# Patient Record
Sex: Male | Born: 1970 | Race: White | Hispanic: No | Marital: Married | Smoking: Never smoker
Health system: Southern US, Community
[De-identification: ages and names within clinical notes are randomized; demographics above are authoritative.]

## PROBLEM LIST (undated history)

## (undated) DIAGNOSIS — I10 Essential (primary) hypertension: Secondary | ICD-10-CM

## (undated) DIAGNOSIS — K219 Gastro-esophageal reflux disease without esophagitis: Secondary | ICD-10-CM

## (undated) HISTORY — PX: CHOLECYSTECTOMY: SHX55

## (undated) HISTORY — PX: APPENDECTOMY: SHX54

## (undated) HISTORY — PX: TONSILLECTOMY: SUR1361

---

## 2003-06-06 ENCOUNTER — Emergency Department (HOSPITAL_COMMUNITY): Admission: EM | Admit: 2003-06-06 | Discharge: 2003-06-06 | Payer: Self-pay

## 2005-05-15 ENCOUNTER — Ambulatory Visit: Payer: Self-pay | Admitting: Internal Medicine

## 2005-05-15 ENCOUNTER — Observation Stay (HOSPITAL_COMMUNITY): Admission: EM | Admit: 2005-05-15 | Discharge: 2005-05-16 | Payer: Self-pay | Admitting: Emergency Medicine

## 2005-05-17 ENCOUNTER — Ambulatory Visit: Payer: Self-pay

## 2008-07-07 ENCOUNTER — Emergency Department (HOSPITAL_COMMUNITY): Admission: EM | Admit: 2008-07-07 | Discharge: 2008-07-07 | Payer: Self-pay | Admitting: Emergency Medicine

## 2008-07-08 ENCOUNTER — Emergency Department (HOSPITAL_COMMUNITY): Admission: EM | Admit: 2008-07-08 | Discharge: 2008-07-08 | Payer: Self-pay | Admitting: Emergency Medicine

## 2008-07-09 ENCOUNTER — Ambulatory Visit (HOSPITAL_COMMUNITY): Admission: RE | Admit: 2008-07-09 | Discharge: 2008-07-09 | Payer: Self-pay | Admitting: Emergency Medicine

## 2008-08-30 ENCOUNTER — Emergency Department (HOSPITAL_COMMUNITY): Admission: EM | Admit: 2008-08-30 | Discharge: 2008-08-30 | Payer: Self-pay | Admitting: Emergency Medicine

## 2008-09-01 ENCOUNTER — Encounter (INDEPENDENT_AMBULATORY_CARE_PROVIDER_SITE_OTHER): Payer: Self-pay | Admitting: *Deleted

## 2008-09-10 ENCOUNTER — Ambulatory Visit (HOSPITAL_COMMUNITY): Admission: RE | Admit: 2008-09-10 | Discharge: 2008-09-10 | Payer: Self-pay | Admitting: General Surgery

## 2008-09-10 ENCOUNTER — Encounter (INDEPENDENT_AMBULATORY_CARE_PROVIDER_SITE_OTHER): Payer: Self-pay | Admitting: General Surgery

## 2010-05-13 LAB — CBC
MCHC: 35.1 g/dL (ref 30.0–36.0)
Platelets: 135 10*3/uL — ABNORMAL LOW (ref 150–400)
WBC: 6.3 10*3/uL (ref 4.0–10.5)

## 2010-05-13 LAB — BASIC METABOLIC PANEL
BUN: 11 mg/dL (ref 6–23)
CO2: 26 mEq/L (ref 19–32)
Calcium: 9.3 mg/dL (ref 8.4–10.5)
GFR calc Af Amer: >60 mL/min (ref >60)
GFR calc non Af Amer: >60 mL/min (ref >60)
Potassium: 4.3 mEq/L (ref 3.5–5.1)

## 2010-05-14 LAB — URINALYSIS, ROUTINE W REFLEX MICROSCOPIC
Bilirubin Urine: NEGATIVE
Glucose, UA: NEGATIVE mg/dL
Hgb urine dipstick: NEGATIVE
Nitrite: NEGATIVE
Specific Gravity, Urine: >1.03 — ABNORMAL HIGH (ref 1.005–1.030)
pH: 6.5 (ref 5.0–8.0)

## 2010-05-15 LAB — URINALYSIS, ROUTINE W REFLEX MICROSCOPIC
Glucose, UA: NEGATIVE mg/dL
Hgb urine dipstick: NEGATIVE
Ketones, ur: NEGATIVE mg/dL
Nitrite: NEGATIVE
Protein, ur: NEGATIVE mg/dL
Protein, ur: NEGATIVE mg/dL
Specific Gravity, Urine: 1.021 (ref 1.005–1.030)
Specific Gravity, Urine: >1.03 — ABNORMAL HIGH (ref 1.005–1.030)
Urobilinogen, UA: 0.2 mg/dL (ref 0.0–1.0)
pH: 7 (ref 5.0–8.0)

## 2010-05-15 LAB — COMPREHENSIVE METABOLIC PANEL
ALT: 64 U/L — ABNORMAL HIGH (ref 0–53)
Albumin: 3.6 g/dL (ref 3.5–5.2)
Albumin: 3.8 g/dL (ref 3.5–5.2)
Alkaline Phosphatase: 63 U/L (ref 39–117)
Alkaline Phosphatase: 69 U/L (ref 39–117)
CO2: 29 mEq/L (ref 19–32)
Calcium: 8.9 mg/dL (ref 8.4–10.5)
Chloride: 104 mEq/L (ref 96–112)
Creatinine, Ser: 0.94 mg/dL (ref 0.4–1.5)
GFR calc Af Amer: >60 mL/min (ref >60)
GFR calc non Af Amer: >60 mL/min (ref >60)
Glucose, Bld: 126 mg/dL — ABNORMAL HIGH (ref 70–99)
Glucose, Bld: 120 mg/dL — ABNORMAL HIGH (ref 70–99)
Sodium: 138 mEq/L (ref 135–145)
Total Bilirubin: 0.9 mg/dL (ref 0.3–1.2)
Total Bilirubin: 0.7 mg/dL (ref 0.3–1.2)
Total Protein: 6.5 g/dL (ref 6.0–8.3)

## 2010-05-15 LAB — CBC
HCT: 43.1 % (ref 39.0–52.0)
MCV: 93 fL (ref 78.0–100.0)
MCV: 92.7 fL (ref 78.0–100.0)
Platelets: 219 10*3/uL (ref 150–400)
RDW: 12.3 % (ref 11.5–15.5)
RDW: 12.7 % (ref 11.5–15.5)
WBC: 6.3 10*3/uL (ref 4.0–10.5)

## 2010-05-15 LAB — DIFFERENTIAL
Basophils Relative: 1 % (ref 0–1)
Eosinophils Absolute: 0.1 10*3/uL (ref 0.0–0.7)
Lymphocytes Relative: 25 % (ref 12–46)
Monocytes Absolute: 0.9 10*3/uL (ref 0.1–1.0)
Monocytes Relative: 10 % (ref 3–12)
Neutro Abs: 6.8 10*3/uL (ref 1.7–7.7)
Neutro Abs: 3.8 10*3/uL (ref 1.7–7.7)
Neutrophils Relative %: 61 % (ref 43–77)

## 2010-05-15 LAB — LIPASE, BLOOD: Lipase: 31 U/L (ref 11–59)

## 2010-05-15 LAB — URINE CULTURE: Colony Count: NO GROWTH

## 2010-06-20 NOTE — Op Note (Signed)
NAME:  Anthony Lin, SEABROOKS NO.:  [id number]   MEDICAL RECORD NO.:  [id number]          PATIENT TYPE:  AMB   LOCATION:  DAY                           FACILITY:  [facility]   PHYSICIAN:  Tilford Pillar, MD      DATE OF BIRTH:  May 20, 1970   DATE OF PROCEDURE:  09/10/2008  DATE OF DISCHARGE:  09/10/2008                               OPERATIVE REPORT   PREOPERATIVE DIAGNOSIS:  Biliary dyskinesia.   POSTOPERATIVE DIAGNOSIS:  Biliary dyskinesia.   PROCEDURE:  Laparoscopic cholecystectomy.   SURGEON:  Tilford Pillar, MD   ANESTHESIA:  General endotracheal, local anesthetic 0.5% Sensorcaine  plain.   SPECIMEN:  Gallbladder.   ESTIMATED BLOOD LOSS:  Minimal.   INDICATIONS:  The patient is a 40 year old male, who presented to my  office with a history of epigastric and right upper quadrant abdominal  pain, did have an extensive workup, was demonstrated to have decreased  biliary ejection fraction on hiatus scan as well as some exacerbation of  symptomatology.  The risks, benefits, and alternatives of the  laparoscopic possible open cholecystectomy for biliary dyskinesia was  discussed at length.  The patient including, but not limited to the risk  of bleeding, infection, bile leak, small bowel injury, bile duct injury,  as well as the possibility of intraoperative cardiac and pulmonary  events.  The patient's questions and concerns were addressed, and the  patient was consented for the planned procedure.   OPERATION:  The patient was taken to the operating room, placed in  supine position on the operating table, at which time the general  anesthetic was administered.  Once the patient was asleep, he was  endotracheally intubated by anesthesia.  At this time, his abdomen was  prepped with DuraPrep solution and draped in a standard fashion.  A stab  incision was created supraumbilically with a 11 blade scalpel.  Additional dissection down to the subcuticular tissue carried out  using  a Kocher clamp, which was utilized to grasp the anterior abdominal  fascia and move this anteriorly.  A Veress needle was then inserted.  Saline drop test was utilized to confirm intraperitoneal placement.  The  pneumoperitoneum was initiated.  Once the patient's pneumoperitoneum was  obtained, an 11-mm trocar was inserted over the laparoscope allowing  visualization of the trocar entering into the peritoneal cavity.  At  this time, the inner cannula was removed.  The laparoscope was  reinserted.  There is no evidence of any trocar or Veress needle  placement injury.  Remaining trocars were placed with a 11-mm trocar in  the epigastrium, a 5-mm trocar in the midline between two 11-mm trocars  and a 5-mm trocar in the right lateral abdominal wall.  The patient was  placed into a reverse Trendelenburg left lateral decubitus position.  The fundus of the gallbladder was identified and was grasped anteriorly.  The body of the gallbladder are bluntly stripped using a [place] as some of the adhesions are adherent to the capsule of the  liver.  These are taken down with electrocautery to avoid damaging  or  injury to the hepatic capsule.  Upon exposure of the infundibulum, the  peritoneal reflection off the infundibulum is bluntly stripped using the  [place].  The cystic duct and cystic artery are identified.  Window was created behind both these structures.  Two EndoClips were  placed proximally and 1 distally on the cystic artery.  Similarly, 3  EndoClips were placed proximally, 1 distally on the cystic duct.  The  cystic duct and cystic artery identified wide opened 2 more distal  clips.  At this time, electrocautery utilized to dissect the gallbladder  free from the gallbladder.  A small cholecystotomy was created during  the dissection.  This was controlled.  Once the gallbladder was  reinspected with the EndoCatch bag.  The gallbladder fossa was  inspected.   Hemostasis was excellent.  The EndoClips were noted to be in  excellent position.  There was no bleeding or bile leak noted.  The  peritoneal cavity was then irrigated with copious amount of sterile  saline and the returning aspirate was clear using a suction aspirator.  At this time, attention was turned to closure.   Using an Endoclose suture passing device, the 2-0 Vicryl sutures passed  through both the 11-mm trocar sites with these sutures in place.  The  gallbladder was grasped and retrieved through the umbilical trocar site  minimal.  Once, the patient was required to enlarge the trocar site  adequately to the gallbladder.  Once the gallbladder was free, it was  placed in the back table and sent as a permanent specimen to pathology.  Of note, he was treated in intact EndoCatch bag.  At this time, the  remaining fluid was aspirated as the patient returned back to supine  position.  Pneumoperitoneum was evacuated.  The Vicryl suture trocars  were removed.  The Vicryl sutures were secured.  Local anesthetic was  instilled, 4-0 Monocryl was utilized to reapproximate the skin edges at  all 4 trocar sites.  Skin was washed and dried with moist and dry towel.  Benzoin was applied around the incision.  Half-inch Steri-Strips were  placed.  The drapes removed.  The patient was allowed to come out of  general anesthetic and the patient was transferred back to regular  hospital bed.  He was transferred to the postanesthetic care unit in  stable condition.  At the conclusion of the procedure, all instrument,  sponge, and needle counts were correct.  The patient tolerated the  procedure extremely well.      Tilford Pillar, MD  Electronically Signed     BZ/MEDQ  D:  09/10/2008  T:  09/11/2008  Job:  [id number]

## 2010-06-20 NOTE — H&P (Signed)
NAME:  Anthony Lin, Anthony Lin NO.:  [id number]   MEDICAL RECORD NO.:  [id number]          PATIENT TYPE:  AMB   LOCATION:  DAY                           FACILITY:  [facility]   PHYSICIAN:  Tilford Pillar, MD      DATE OF BIRTH:  05-May-1970   DATE OF ADMISSION:  DATE OF DISCHARGE:  LH                              HISTORY & PHYSICAL   CHIEF COMPLAINT:  Epigastric abdominal pain.   HISTORY OF PRESENT ILLNESS:  The patient is a 40 year old male who  presented to my office with several month history of increasing episodes  of epigastric sharp constant pain, these episodes lasted several hours  and has spontaneously resolved.  He does state at some condition, it is  increased with fatty foods and fruits, does have some voiding with fatty  foods and fruits.  During exacerbation, there are still revealing  features.  Pain does radiate to the chest occasionally.  He had been  evaluated and was actually seen by a surgeon in [place]; however;  delay in the operative scheduling, I encouraged the patient to seek  secondary opinion and additional surgical consultation.  He has had no  fever or chills.  No history of nausea and vomiting.  No change in bowel  movements, although he does state one or two episodes suspicious for  pink colored stools.  No melena.  No hematochezia.  No change in urinary  history, again no jaundice.   PAST MEDICAL HISTORY:  None.   PAST SURGICAL HISTORY:  Last previous laparoscopic appendectomy.   MEDICATIONS:  Prilosec.   ALLERGIES:  No known drug allergies.   SOCIAL HISTORY:  No tobacco.  No alcohol.  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   PERTINENT FAMILY HISTORY:  He does have father who has gallstones.   REVIEW OF SYSTEMS:  Unremarkable other than abdominal or  gastrointestinal pain as per HPI, otherwise all other systems are  unremarkable including constitutional, eyes, ears, nose and throat,  respiratory, cardiovascular,  genitourinary, musculoskeletal, skin,  neuro, and endocrine.   PHYSICAL EXAMINATION:  GENERAL:  The patient is a mildly obese male.  He  is calm.  He is not in any acute distress.  He is alert and oriented x3.  HEENT:  Scalp, no deformities, no masses.  Eyes, pupils are equal,  round, reactive.  Extraocular movements are intact.  No scleral icterus  or conjunctival pallor is noted.  Oral mucosa is pink.  Normal  occlusion.  NECK:  Trachea is midline.  No cervical lymphadenopathy.  PULMONARY:  Unlabored respirations.  No wheezes.  No crackles.  Clear to  auscultation bilaterally.  CARDIOVASCULAR:  Regular rate and rhythm.  He has 2+ radial and femoral  pulses bilaterally.  ABDOMEN:  Positive bowel sounds.  Abdomen is soft.  He has mild right  upper quadrant abdominal pain.  He does not have any classic Murphy sign  on deep inspiration.  No hernias or masses.  He does have several small  scars, one around the umbilicus, one in the left lateral abdominal wall,  and one in the suprapubic region consistent with his previous  laparoscopic appendectomy.  SKIN:  Warm and dry.   PERTINENT LABORATORY AND RADIOGRAPHIC STUDIES:  The patient did have a  right upper ultrasound which demonstrated no evidence of cholelithiasis.  The HIDA scan obtained at [facility] demonstrated an  ejection fraction of 10%.  On administration of the IV today, the  patient did develop some mild epigastric abdominal pain similar to but  not as severe as his presenting features.   ASSESSMENT AND PLAN:  Biliary dyskinesia.  At this time, I did discuss  and reviewed with the patient the risks, benefits, and alternatives of  laparoscopic possible open cholecystectomy including but not limited  risk of bleeding, infection, bile leak, small-bowel injury, common bile  duct injury as well as the possibility of intraoperative cardiac and  pulmonary events.  At this time, I do suspect majority of his symptoms   are coming from gallbladder etiology.  I did discuss additional causes  of epigastric and right upper quadrant abdominal pain and I did discuss  with the patient that should he develop any symptomatology or continued  to have symptomatology in a postoperative state that at that time we  would consider scheduling and having evaluation of possible upper  endoscopy, however, at this time I think this will provide little  benefit and again discussed with the patient's surgical intervention and  at that time we will plan to proceed at the patient's earliest  convenience.      Tilford Pillar, MD  Electronically Signed     BZ/MEDQ  D:  09/09/2008  T:  09/10/2008  Job:  [id number]

## 2010-06-23 NOTE — H&P (Signed)
NAME:  Anthony Lin, Anthony Lin NO.:  [id number]   MEDICAL RECORD NO.:  [id number]          PATIENT TYPE:  INP   LOCATION:  1824                         FACILITY:  [facility]   PHYSICIAN:  Arvilla Meres, M.D. LHCDATE OF BIRTH:  09/25/1970   DATE OF ADMISSION:  05/15/2005  DATE OF DISCHARGE:                                HISTORY & PHYSICAL   PRIMARY CARE PHYSICIAN:  Kirstie Peri, MD   CHIEF COMPLAINT:  Chest pain.   HISTORY OF PRESENT ILLNESS:  Anthony Lin is a very pleasant 40 year old male  with no known history of coronary artery disease who presents to the [facility] emergency room tonight for evaluation of chest pain. He has had  atypical chest pain over the last two weeks. He describes it as a sharp  substernal sensation that lasts less than a second. He denies any exertional  chest pain. His pain usually occurs at rest and goes away on its own. He has  noted some dyspnea on exertion over the last couple of weeks. This is  unusual for him. He notes it with simple activities such as taking a shower  and walking up steps. He has also noted some palpitations. He saw his  primary care physician who set him for a stress test at [facility].  The patient, however, did not go for the test. Today, while at work, he  developed recurrent chest pain as well as shortness of breath and  palpitations. He felt some pressure under his ribs. This concerned him and  he came into the emergency room. He has noted some left elbow pain today.  He denies any radiation into his  jaw. He denies any associated nausea,  vomiting, or diaphoresis. He denies any syncope. He denies any relation to  meals.   PAST MEDICAL HISTORY:  Significant for gastroesophageal reflux disease. He  is status post tonsillectomy, adenoidectomy. He denies any history of  diabetes mellitus, hypertension, or hyperlipidemia.   MEDICATIONS:  Prilosec 20 mg daily.   ALLERGIES:  No known drug allergies.   SOCIAL  HISTORY:  The patient lives in [place] with his wife and his three  children. He denies any history of tobacco or alcohol abuse. He works in a  [employer].   FAMILY HISTORY:  Significant for coronary artery disease. His mother is  status post CABG.  She had her first myocardial infarction in her 40s. He  has a half brother with a myocardial infarction in his 40s.   REVIEW OF SYSTEMS:  Please see HPI. He denies any fever, chills, or sweats.  Denies any sore throat, odynophagia, or dysphagia. Denies any melena,  hematochezia, hematuria, or dysuria. Denies any myalgias. He does note  bilateral knee, elbow, and neck pain. He has had some headache recently that  are frontal in nature that are associated with nausea and photophobia. He  denies any orthopnea or paroxysmal nocturnal dyspnea. He denies any edema,  presyncope, or syncope. He denies any intermittent claudication. He denies  any symptoms consistent with TIAs or amaurosis fugax. He denies any bleeding  disorders.  He denies any rashes. He denies any heat or cold intolerance,  skin or hair changes, or voice changes. The rest of review of systems  negative.   PHYSICAL EXAMINATION:  GENERAL: He is a well-developed, well-nourished man.  VITAL SIGNS: Blood pressure is 117/7, pulse 68, respirations 20, temperature  98.3, oxygen saturation 98% on room air.  HEENT: Normocephalic and atraumatic. PERRLA. EOMI. Sclerae clear. Oropharynx  without exudate. Fair dentition.  NECK: Without lymphadenopathy. No JVD.  ENDOCRINE: Without thyromegaly. Carotids without bruits bilaterally.  CARDIAC: Normal S1 and S2. Regular rate and rhythm without murmurs, clicks,  rubs, or gallops  LUNGS: Clear to auscultation bilaterally without rales, rhonchi, or wheezes.  SKIN: Without rashes.  ABDOMEN: Soft, nontender, normoactive bowel sounds.  No hepatomegaly. No  pulsatile masses. No bruits.  VASCULAR EXAM:  2+ femoral artery pulses, dorsalis pedis pulses,  and  posterior tibialis pulses bilaterally. No bruits in the femoral artery is  noted.  EXTREMITIES: Without clubbing, cyanosis, or edema.  MUSCULOSKELETAL:  Without spine or CVA tenderness.  NEUROLOGIC: Alert and oriented times three. Cranial nerves II-XII grossly  intact. Strength 5/5 in all extremities in actual groups.   Chest x-ray reveals no acute disease. EKG reveals a normal sinus rhythm with  a heart rate of 77, normal axis, nonspecific ST-T wave changes. No Q-waves.   Laboratory data reveals a white count of 8500, hemoglobin 15.9, hematocrit  45.6, platelet count 387,000. Point of care enzymes are negative times two.   IMPRESSION:  1.  Atypical chest pain.  2.  Dyspnea on exertion.  3.  Family history of coronary artery disease.  4.  Gastroesophageal reflux disease.   PLAN:  The patient was interviewed and examined by Dr. Gala Romney. We plan to  admit him to [facility] tonight and check serial enzymes and EKGs.  We will also check a D-dimer. Will treat him with aspirin and beta blockers.  If he rules out will set him up for an outpatient stress Myoview study and  echocardiogram.      Tereso Newcomer, P.A.      Arvilla Meres, M.D. [facility]  Electronically Signed    SW/MEDQ  D:  05/15/2005  T:  05/15/2005  Job:  [id number]   cc:   Kirstie Peri, MD  Fax: [phone]

## 2010-06-23 NOTE — Discharge Summary (Signed)
NAME:  Anthony Lin, Anthony Lin NO.:  [id number]   MEDICAL RECORD NO.:  [id number]          PATIENT TYPE:  OBV   LOCATION:  4708                         FACILITY:  [facility]   PHYSICIAN:  Jesse Sans. Wall, M.D.   DATE OF BIRTH:  September 17, 1970   DATE OF ADMISSION:  05/15/2005  DATE OF DISCHARGE:  05/16/2005                                 DISCHARGE SUMMARY   PRIMARY CARE PHYSICIAN:  Kirstie Peri, MD.   CARDIOLOGIST:  Arvilla Meres, M.D.   DISCHARGE DIAGNOSIS:  Atypical chest pain, questionable gastrointestinal  etiology with negative cardiac work-up.   PAST MEDICAL HISTORY:  GERD.   HISTORY OF PRESENT ILLNESS:  Anthony Lin is a 40 year old Caucasian gentleman  with no known history of coronary artery disease who presented to [place]  [facility] Emergency Room for evaluation of chest pain.  Patient  had had atypical chest pain over two weeks.  He also noticed some  palpitations.  Patient initially saw his primary care physician who set him  up for a stress test at [facility], however, patient did not go for  the test and then on the day of admission while at work he developed  recurrent chest pain as well as some shortness of breath and palpitations  with some pressure under his ribs.  He came to [facility] [facility] Room for further evaluation.  Initial EKG showed normal  sinus rhythm with nonspecific STT wave changes.  Chest x-ray showing no  acute disease.  Point of care enzymes were negative x2 with hematocrit of  45.6.  Patient was seen by Dr. Gala Romney.  He was admitted for observation,  ruled out for myocardial infarction.  D-dimer within normal limits.  THS  within normal limits.  Blood pressure 117/65, patient afebrile.  Seen by Dr.  Juanito Doom on the day of discharge.  Patient discharged home with arrangements  for an outpatient stress Myoview scheduled for May 17, 2005, at 12:30.   DISCHARGE MEDICATIONS:  1.  Prilosec 20 mg  daily or as previously taken.  2.  Aspirin 81 mg daily.   Patient given instructions regarding preparation for stress test, to follow  up with Dr. Gala Romney on May 18, 2005, at 12:30.     Dorian Pod, NP      Jesse Sans. Wall, M.D.  Electronically Signed   MB/MEDQ  D:  06/25/2005  T:  06/26/2005  Job:  [id number]

## 2010-06-23 NOTE — Discharge Summary (Signed)
NAME:  Anthony Lin, Anthony Lin NO.:  [id number]   MEDICAL RECORD NO.:  [id number]          PATIENT TYPE:  OBV   LOCATION:  4708                         FACILITY:  [facility]   PHYSICIAN:  Jesse Sans. Wall, M.D.   DATE OF BIRTH:  1970-10-03   DATE OF ADMISSION:  05/15/2005  DATE OF DISCHARGE:  05/16/2005                                 DISCHARGE SUMMARY   DISCHARGING DIAGNOSIS:  Atypical chest pain, negative cardiac enzymes, EKG  without acute changes.   PAST MEDICAL HISTORY:  1.  Hyperlipidemia.  2.  GERD.   FAMILY HISTORY:  Coronary artery disease.   Status post tonsillectomy and adenoidectomy.   HOSPITAL COURSE:  Anthony Lin is a 40 year old Caucasian gentleman with no  known history of coronary artery disease, who is followed by Kirstie Peri,  M.D., in [place].  He was initially seen by Arvilla Meres, M.D., in  consultation at [facility] ER for evaluation of chest pain.  This pain is  somewhat atypical.  He has had it for several weeks prior to admission.   Initial lab work:  Chest x-ray showing no acute disease.  EKG:  Normal sinus  rhythm, nonspecific ST-T wave changes.  Hemoglobin 15.9.  Point of care  cardiac enzymes negative x2.  D-dimer of 0.22.   The patient was admitted for observation.  Ruled out for myocardial  infarction.  EKG without any changes.  TSH within normal limits.  Dr. Juanito Doom in to see the patient on the day of discharge.  Blood pressure 117/65,  heart rate 60, patient afebrile, saturation 97% on room air.  Patient being  discharged home with scheduled adenosine Myoview.  Patient to follow up with  Dr. Gala Romney.   At time of discharge medications include:  1.  Prilosec 20 mg daily as previously taken.  2.  Aspirin 81 mg daily.   The patient was scheduled for an outpatient Myoview on May 18, 2005, at  12:30.  Patient to follow up with Dr. Gala Romney to review results of the  stress test.  The patient understood discharge instructions.   DURATION OF DISCHARGE ENCOUNTER:  20 minutes.      Dorian Pod, NP      Jesse Sans. Wall, M.D.  Electronically Signed    MB/MEDQ  D:  06/12/2005  T:  06/13/2005  Job:  [id number]   cc:   Kirstie Peri, MD  Fax: [phone]

## 2010-11-25 IMAGING — CR DG ABDOMEN ACUTE W/ 1V CHEST
4 series · 4 of 4 positions shown · non-contrast
Comparison: Chest radiograph 05/15/2005.

CLINICAL DATA: 37-year-old male with sudden onset of epigastric,
abdominal pain.

ACUTE ABDOMEN SERIES (ABDOMEN 2 VIEW & CHEST 1 VIEW)

[w chest pa]
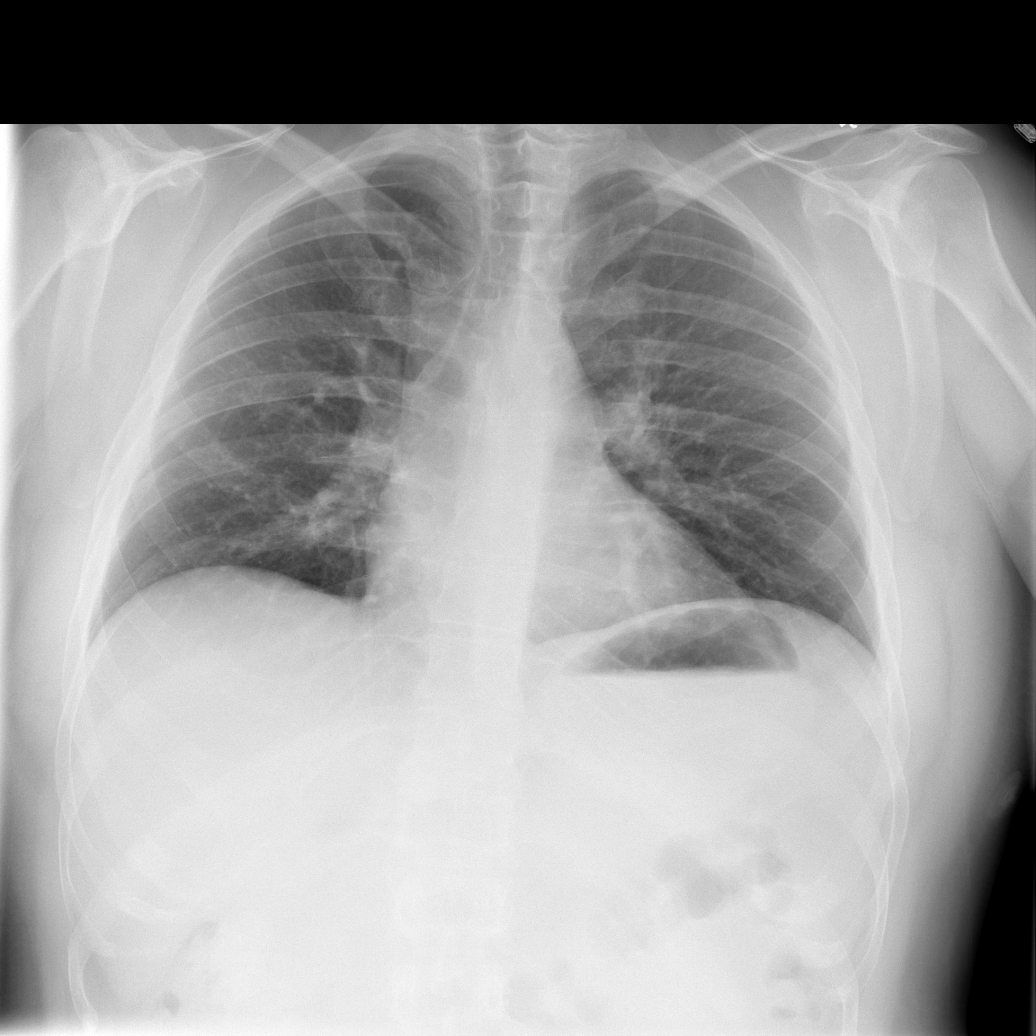

[w abdomen upright]
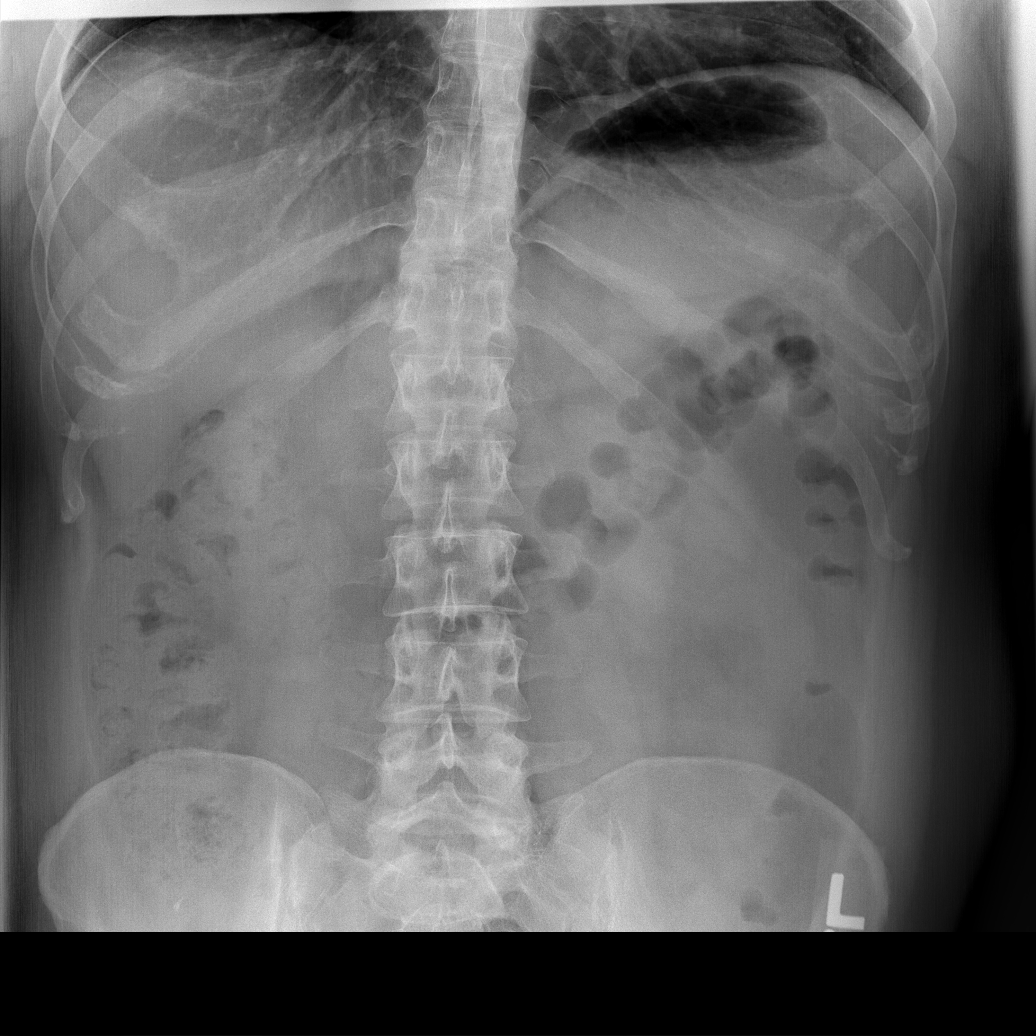

[t abdomen supine (1 of 2)]
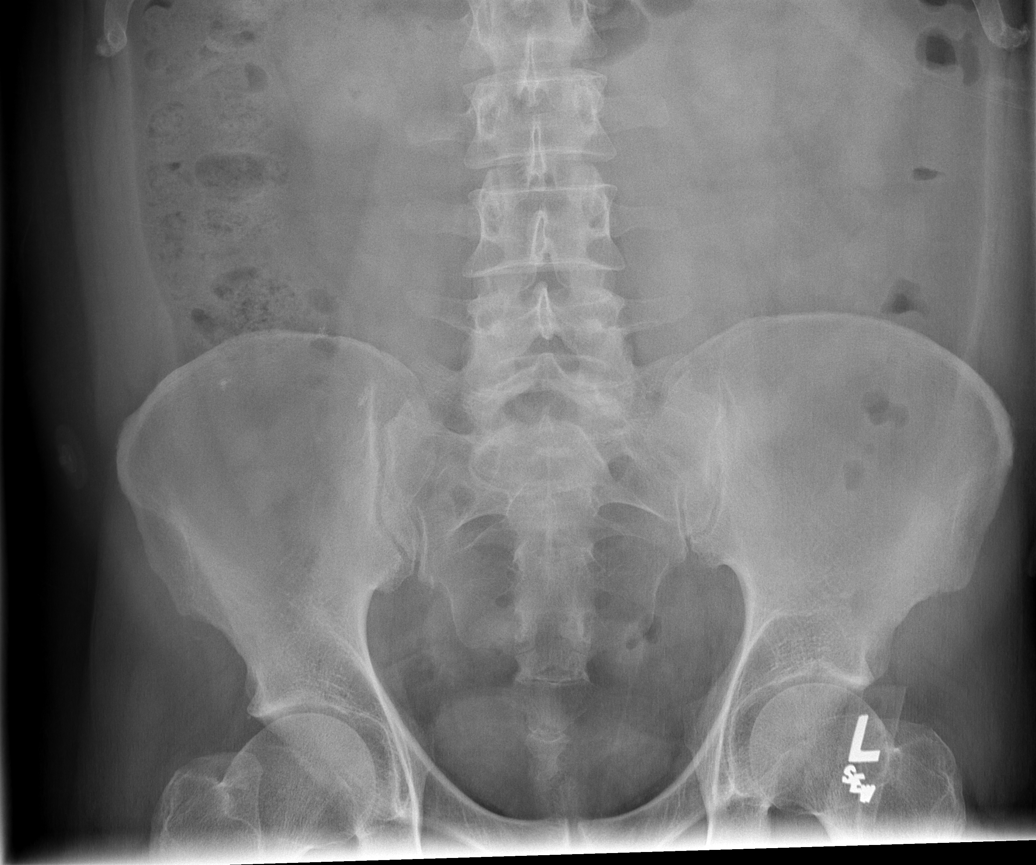

[t abdomen supine (2 of 2)]
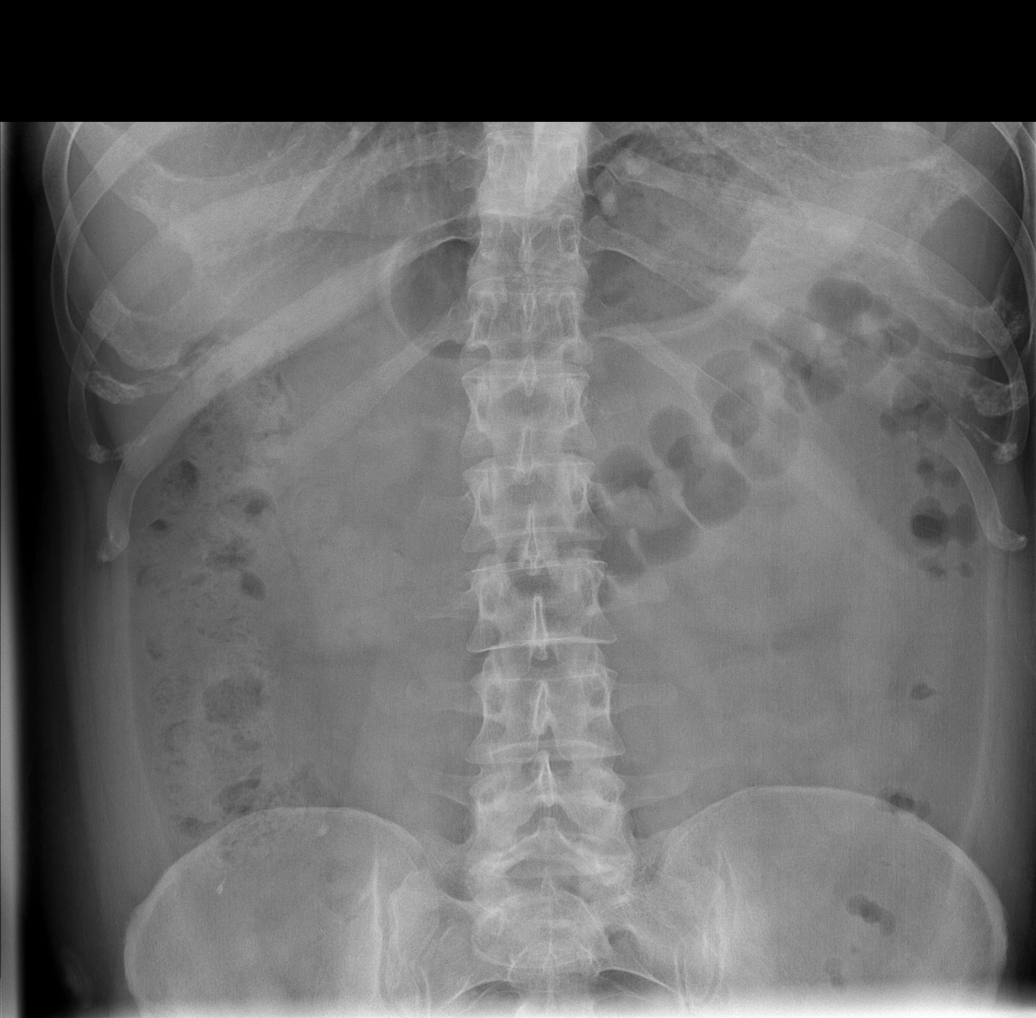

[4 of 4 positions shown; findings below may reference images not displayed]

FINDINGS: Slightly lower lung volumes.  Cardiac size and
mediastinal contours are within normal limits.  The lungs are
clear.  No pneumothorax or pneumoperitoneum.

Nonobstructed bowel gas pattern. Visceral contours are within
normal limits.  No definite urologic calculus identified.  No acute
osseous abnormality identified.
IMPRESSION: 1. Nonobstructed bowel gas pattern, no free air.
2. No acute cardiopulmonary abnormality.

## 2013-05-18 ENCOUNTER — Encounter (INDEPENDENT_AMBULATORY_CARE_PROVIDER_SITE_OTHER): Payer: Self-pay

## 2013-05-18 ENCOUNTER — Encounter: Payer: Self-pay | Admitting: Family Medicine

## 2013-05-18 ENCOUNTER — Ambulatory Visit (INDEPENDENT_AMBULATORY_CARE_PROVIDER_SITE_OTHER): Payer: Commercial Managed Care - PPO | Admitting: Family Medicine

## 2013-05-18 VITALS — BP 137/78 | HR 84 | Temp 98.2°F | Ht 71.25 in | Wt 259.0 lb

## 2013-05-18 DIAGNOSIS — J209 Acute bronchitis, unspecified: Secondary | ICD-10-CM

## 2013-05-18 MED ORDER — AMOXICILLIN 875 MG PO TABS: 875.0000 mg | ORAL_TABLET | Freq: Two times a day (BID) | ORAL | Status: DC

## 2013-05-18 MED ORDER — ALBUTEROL SULFATE HFA 108 (90 BASE) MCG/ACT IN AERS: 2.0000 | INHALATION_SPRAY | Freq: Four times a day (QID) | RESPIRATORY_TRACT | Status: DC | PRN

## 2013-05-18 MED ORDER — METHYLPREDNISOLONE ACETATE 80 MG/ML IJ SUSP
80.0000 mg | Freq: Once | INTRAMUSCULAR | Status: AC
  Administered 2013-05-18: 80 mg via INTRAMUSCULAR

## 2013-05-18 MED ORDER — HYDROCODONE-HOMATROPINE 5-1.5 MG/5ML PO SYRP: 5.0000 mL | ORAL_SOLUTION | Freq: Three times a day (TID) | ORAL | Status: DC | PRN

## 2013-05-18 NOTE — Progress Notes (Signed)
   Subjective:    Patient ID: Anthony Lin, male    DOB: 03-12-1970, 43 y.o.   MRN: [id number]  HPI  This 43 y.o. male presents for evaluation of uri sx's and cough.  He is having difficulty Sleeping at night due to severe cough.  Review of Systems C/o uri sx's and cough   No chest pain, SOB, HA, dizziness, vision change, N/V, diarrhea, constipation, dysuria, urinary urgency or frequency, myalgias, arthralgias or rash.  Objective:   Physical Exam  Vital signs noted  Well developed well nourished male.  HEENT - Head atraumatic Normocephalic                Eyes - PERRLA, Conjuctiva - clear Sclera- Clear EOMI                Ears - EAC's Wnl TM's Wnl Gross Hearing WNL                Nose - Nares patent                 Throat - oropharanx wnl Respiratory - Lungs with expiratory wheezes bilateral Cardiac - RRR S1 and S2 without murmur GI - Abdomen soft Nontender and bowel sounds active x 4       Assessment & Plan:  Acute bronchitis - Plan: amoxicillin (AMOXIL) 875 MG tablet, albuterol (PROVENTIL HFA;VENTOLIN HFA) 108 (90 BASE) MCG/ACT inhaler, HYDROcodone-homatropine (HYCODAN) 5-1.5 MG/5ML syrup, methylPREDNISolone acetate (DEPO-MEDROL) injection 80 mg  Push po fluids, rest, tylenol and motrin otc prn as directed for fever, arthralgias, and myalgias.  Follow up prn if sx's continue or persist.  Lysbeth Penner FNP

## 2013-10-23 ENCOUNTER — Ambulatory Visit (INDEPENDENT_AMBULATORY_CARE_PROVIDER_SITE_OTHER): Payer: Commercial Managed Care - PPO | Admitting: Family Medicine

## 2013-10-23 VITALS — BP 139/89 | HR 90 | Temp 98.6°F | Ht 71.25 in | Wt 260.6 lb

## 2013-10-23 DIAGNOSIS — M659 Synovitis and tenosynovitis, unspecified: Secondary | ICD-10-CM

## 2013-10-23 DIAGNOSIS — IMO0002 Reserved for concepts with insufficient information to code with codable children: Secondary | ICD-10-CM

## 2013-10-23 DIAGNOSIS — L03114 Cellulitis of left upper limb: Secondary | ICD-10-CM

## 2013-10-23 MED ORDER — DOXYCYCLINE HYCLATE 100 MG PO TABS
100.0000 mg | ORAL_TABLET | Freq: Two times a day (BID) | ORAL | Status: DC
Start: 2013-10-23 — End: 2014-03-24

## 2013-10-23 MED ORDER — NAPROXEN 500 MG PO TABS: 500.0000 mg | ORAL_TABLET | Freq: Two times a day (BID) | ORAL | Status: DC

## 2013-10-23 NOTE — Progress Notes (Signed)
   Subjective:    Patient ID: Anthony Lin, male    DOB: 02-23-1970, 43 y.o.   MRN: [id number]  HPI This 43 y.o. male presents for evaluation of possible spider bite on left elbow and now has pain Radiating into left forearm.   Review of Systemsc/o left arm pain No chest pain, SOB, HA, dizziness, vision change, N/V, diarrhea, constipation, dysuria, urinary urgency or frequency, myalgias, arthralgias or rash.     Objective:   Physical Exam  Vital signs noted  Well developed well nourished male.  HEENT - Head atraumatic Normocephalic Respiratory - Lungs CTA bilateral Cardiac - RRR S1 and S2 without murmur GI - Abdomen soft Nontender and bowel sounds active x 4 Extremities - No edema. Neuro - Grossly intact. MS - Tenderness left forearm  Skin - Erythema left olecranon area      Assessment & Plan:  Left arm cellulitis - Plan: naproxen (NAPROSYN) 500 MG tablet, doxycycline (VIBRA-TABS) 100 MG tablet  Tenosynovitis - Plan: naproxen (NAPROSYN) 500 MG tablet, doxycycline (VIBRA-TABS) 100 MG tablet  Lysbeth Penner FNP

## 2014-03-24 ENCOUNTER — Ambulatory Visit (INDEPENDENT_AMBULATORY_CARE_PROVIDER_SITE_OTHER): Payer: Commercial Managed Care - PPO | Admitting: Family

## 2014-03-24 ENCOUNTER — Encounter: Payer: Self-pay | Admitting: Family

## 2014-03-24 VITALS — BP 135/89 | HR 98 | Temp 98.3°F | Ht 71.25 in | Wt 261.8 lb

## 2014-03-24 DIAGNOSIS — J019 Acute sinusitis, unspecified: Secondary | ICD-10-CM

## 2014-03-24 MED ORDER — AMOXICILLIN-POT CLAVULANATE 875-125 MG PO TABS: 1.0000 | ORAL_TABLET | Freq: Two times a day (BID) | ORAL | Status: DC

## 2014-03-24 MED ORDER — ALBUTEROL SULFATE HFA 108 (90 BASE) MCG/ACT IN AERS: 2.0000 | INHALATION_SPRAY | Freq: Four times a day (QID) | RESPIRATORY_TRACT | Status: DC | PRN

## 2014-03-24 NOTE — Progress Notes (Signed)
Subjective:    Patient ID: Anthony Lin, male    DOB: [birth year], 44 y.o.   MRN: [id number]  Sinusitis This is a new problem. The current episode started in the past 7 days. The problem has been waxing and waning since onset. The maximum temperature recorded prior to his arrival was 101 - 101.9 F. His pain is at a severity of 10/10. The pain is moderate. Associated symptoms include coughing, ear pain, headaches, shortness of breath, sinus pressure, sneezing and a sore throat. Past treatments include oral decongestants. The treatment provided mild relief.  Headache  Associated symptoms include coughing, ear pain, sinus pressure and a sore throat.      Review of Systems  Constitutional: Negative.   HENT: Positive for ear pain, sinus pressure, sneezing and sore throat.   Respiratory: Positive for cough and shortness of breath.   Cardiovascular: Negative.   Gastrointestinal: Negative.   Endocrine: Negative.   Genitourinary: Negative.   Musculoskeletal: Negative.   Neurological: Positive for headaches.  Hematological: Negative.   Psychiatric/Behavioral: Negative.   All other systems reviewed and are negative.      Objective:   Physical Exam  Constitutional: He is oriented to person, place, and time. He appears well-developed and well-nourished. No distress.  HENT:  Head: Normocephalic.  Right Ear: External ear normal.  Left Ear: External ear normal.  Nose: Right sinus exhibits maxillary sinus tenderness and frontal sinus tenderness. Left sinus exhibits maxillary sinus tenderness and frontal sinus tenderness.  Eyes: Pupils are equal, round, and reactive to light. Right eye exhibits no discharge. Left eye exhibits no discharge.  Neck: Normal range of motion. Neck supple. No thyromegaly present.  Cardiovascular: Normal rate, regular rhythm, normal heart sounds and intact distal pulses.   No murmur heard. Pulmonary/Chest: Effort normal and breath sounds normal. No respiratory  distress. He has no wheezes.  Abdominal: Soft. Bowel sounds are normal. He exhibits no distension. There is no tenderness.  Musculoskeletal: Normal range of motion. He exhibits no edema or tenderness.  Neurological: He is alert and oriented to person, place, and time. He has normal reflexes. No cranial nerve deficit.  Skin: Skin is warm and dry. No rash noted. No erythema.  Psychiatric: He has a normal mood and affect. His behavior is normal. Judgment and thought content normal.  Vitals reviewed.     BP 135/89 mmHg  Pulse 98  Temp(Src) 98.3 F (36.8 C) (Oral)  Ht 5' 11.25" (1.81 m)  Wt 261 lb 12.8 oz (118.752 kg)  BMI 36.25 kg/m2     Assessment & Plan:  1. Acute sinusitis, recurrence not specified, unspecified location -- Take meds as prescribed - Use a cool mist humidifier  -Use saline nose sprays frequently -Saline irrigations of the nose can be very helpful if done frequently.  * 4X daily for 1 week*  * Use of a nettie pot can be helpful with this. Follow directions with this* -Force fluids -For any cough or congestion  Use plain Mucinex- regular strength or max strength is fine   * Children- consult with Pharmacist for dosing -For fever or aces or pains- take tylenol or ibuprofen appropriate for age and weight.  * for fevers greater than 101 orally you may alternate ibuprofen and tylenol every  3 hours. -Throat lozenges if help - amoxicillin-clavulanate (AUGMENTIN) 875-125 MG per tablet; Take 1 tablet by mouth 2 (two) times daily.  Dispense: 14 tablet; Refill: 0 - albuterol (PROVENTIL HFA;VENTOLIN HFA) 108 (90 BASE) MCG/ACT inhaler; Inhale  2 puffs into the lungs every 6 (six) hours as needed for wheezing or shortness of breath.  Dispense: 1 Inhaler; Refill: [place], FNP

## 2014-03-24 NOTE — Patient Instructions (Signed)

## 2015-09-19 ENCOUNTER — Encounter: Payer: Self-pay | Admitting: Pediatrics

## 2015-09-19 ENCOUNTER — Ambulatory Visit (INDEPENDENT_AMBULATORY_CARE_PROVIDER_SITE_OTHER): Payer: Commercial Managed Care - PPO | Admitting: Pediatrics

## 2015-09-19 VITALS — BP 138/82 | HR 70 | Temp 97.5°F | Ht 71.25 in | Wt 269.0 lb

## 2015-09-19 DIAGNOSIS — J309 Allergic rhinitis, unspecified: Secondary | ICD-10-CM | POA: Diagnosis not present

## 2015-09-19 DIAGNOSIS — Z6837 Body mass index (BMI) 37.0-37.9, adult: Secondary | ICD-10-CM | POA: Diagnosis not present

## 2015-09-19 DIAGNOSIS — R109 Unspecified abdominal pain: Secondary | ICD-10-CM

## 2015-09-19 DIAGNOSIS — M545 Low back pain, unspecified: Secondary | ICD-10-CM | POA: Insufficient documentation

## 2015-09-19 LAB — URINALYSIS, COMPLETE
Bilirubin, UA: NEGATIVE
GLUCOSE, UA: NEGATIVE
KETONES UA: NEGATIVE
LEUKOCYTES UA: NEGATIVE
Nitrite, UA: NEGATIVE
PH UA: 6.5 (ref 5.0–7.5)
PROTEIN UA: NEGATIVE
RBC, UA: NEGATIVE
Specific Gravity, UA: 1.02 (ref 1.005–1.030)
Urobilinogen, Ur: 1 mg/dL (ref 0.2–1.0)

## 2015-09-19 LAB — BAYER DCA HB A1C WAIVED: HB A1C: 5.3 % (ref <7.0)

## 2015-09-19 LAB — MICROSCOPIC EXAMINATION
Bacteria, UA: NONE SEEN
Epithelial Cells (non renal): NONE SEEN /hpf (ref 0–10)
RBC MICROSCOPIC, UA: NONE SEEN /HPF (ref 0–?)
WBC UA: NONE SEEN /HPF (ref 0–?)

## 2015-09-19 MED ORDER — CYCLOBENZAPRINE HCL 10 MG PO TABS: 10.0000 mg | ORAL_TABLET | Freq: Three times a day (TID) | ORAL | 0 refills | Status: DC | PRN

## 2015-09-19 NOTE — Progress Notes (Addendum)
    Subjective:    Patient ID: Anthony Lin, male    DOB: 1970-03-07, 45 y.o.   MRN: [id number]  CC: Back Pain (1 month)   HPI: Anthony Lin is a 45 y.o. male presenting for Back Pain (1 month)  L flank pain ongoing off and on for past month Bad in the morning Eases up once he starts moving  Has had kidney stone in the past, in some ways pain feels similar Moving around sometimes helps, sitting in recliner makes it worse  Polyuria last few days No fevers   Relevant past medical, surgical, family and social history reviewed. Interim medical history since our last visit reviewed. Allergies and medications reviewed and updated.  History  Smoking Status  . Never Smoker  Smokeless Tobacco  . Never Used    ROS: Per HPI      Objective:    BP 138/82   Pulse 70   Temp 97.5 F (36.4 C) (Oral)   Ht 5' 11.25" (1.81 m)   Wt 269 lb (122 kg)   BMI 37.26 kg/m   Wt Readings from Last 3 Encounters:  09/19/15 269 lb (122 kg)  03/24/14 261 lb 12.8 oz (118.8 kg)  10/23/13 260 lb 9.6 oz (118.2 kg)     Gen: NAD, alert, cooperative with exam, NCAT EYES: EOMI, no conjunctival injection, or no icterus ENT:  Nasal turbinates pink, equal b/l, OP without erythema LYMPH: no cervical LAD CV: WWP Resp: normal WOB Abd: +BS, soft, NTND. no guarding or organomegaly Ext: No edema, warm Neuro: Alert and oriented, strength equal b/l UE and LE, coordination grossly normal, patellar reflex 1+ b/l, SLR+  MSK: TTP L lower back paraspinous muscles     Assessment & Plan:  Anthony Lin was seen today for back pain.  Diagnoses and all orders for this visit:  Flank pain UA negative If continues to have pain, will need to rul eout kidney stone -     Urinalysis, Complete  BMI 37.0-37.9, adult Cont lifestyle changes, increase activity, decrease sugar intake -     A1c 5.3 in clinic -     CMP14+EGFR -     Lipid panel  Allergic rhinitis, unspecified allergic rhinitis type flonase twice a  day  Left-sided low back pain without sciatica NSAIDs, rest, ice, heat, gentle back exercises Trial flexeril  Follow up plan: Return in about 2 weeks (around 10/03/2015).  Assunta Found, MD [facility] 09/19/2015, 3:11 PM

## 2015-09-19 NOTE — Addendum Note (Signed)
Addended by: Eustaquio Maize on: 09/19/2015 05:30 PM   Modules accepted: Orders

## 2015-09-19 NOTE — Patient Instructions (Addendum)

## 2015-09-20 LAB — LIPID PANEL
Chol/HDL Ratio: 7.5 ratio units — ABNORMAL HIGH (ref 0.0–5.0)
Cholesterol, Total: 232 mg/dL — ABNORMAL HIGH (ref 100–199)
HDL: 31 mg/dL — AB (ref >39)
LDL Calculated: 147 mg/dL — ABNORMAL HIGH (ref 0–99)
TRIGLYCERIDES: 269 mg/dL — AB (ref 0–149)
VLDL Cholesterol Cal: 54 mg/dL — ABNORMAL HIGH (ref 5–40)

## 2015-09-20 LAB — CMP14+EGFR
A/G RATIO: 1.6 (ref 1.2–2.2)
ALT: 44 IU/L (ref 0–44)
AST: 25 IU/L (ref 0–40)
Albumin: 4 g/dL (ref 3.5–5.5)
Alkaline Phosphatase: 64 IU/L (ref 39–117)
BUN/Creatinine Ratio: 11 (ref 9–20)
BUN: 11 mg/dL (ref 6–24)
Bilirubin Total: 0.5 mg/dL (ref 0.0–1.2)
CALCIUM: 9.1 mg/dL (ref 8.7–10.2)
CHLORIDE: 100 mmol/L (ref 96–106)
CO2: 26 mmol/L (ref 18–29)
Creatinine, Ser: 1.01 mg/dL (ref 0.76–1.27)
GFR calc Af Amer: 105 mL/min/{1.73_m2} (ref >59)
GFR, EST NON AFRICAN AMERICAN: 91 mL/min/{1.73_m2} (ref >59)
Globulin, Total: 2.5 g/dL (ref 1.5–4.5)
Glucose: 88 mg/dL (ref 65–99)
POTASSIUM: 4.4 mmol/L (ref 3.5–5.2)
Sodium: 138 mmol/L (ref 134–144)
Total Protein: 6.5 g/dL (ref 6.0–8.5)

## 2018-09-26 ENCOUNTER — Ambulatory Visit: Payer: Commercial Managed Care - PPO | Admitting: Family Medicine

## 2018-09-26 ENCOUNTER — Other Ambulatory Visit: Payer: Self-pay

## 2018-09-26 DIAGNOSIS — Z20822 Contact with and (suspected) exposure to covid-19: Secondary | ICD-10-CM

## 2018-09-28 LAB — NOVEL CORONAVIRUS, NAA: SARS-CoV-2, NAA: NOT DETECTED

## 2019-07-31 ENCOUNTER — Other Ambulatory Visit: Payer: Self-pay | Admitting: Orthopedic Surgery

## 2019-08-03 ENCOUNTER — Other Ambulatory Visit: Payer: Self-pay | Admitting: Orthopedic Surgery

## 2019-09-07 ENCOUNTER — Other Ambulatory Visit: Payer: Self-pay

## 2019-09-07 ENCOUNTER — Encounter (HOSPITAL_BASED_OUTPATIENT_CLINIC_OR_DEPARTMENT_OTHER): Payer: Self-pay | Admitting: Orthopedic Surgery

## 2019-09-10 ENCOUNTER — Encounter (HOSPITAL_BASED_OUTPATIENT_CLINIC_OR_DEPARTMENT_OTHER)
Admission: RE | Admit: 2019-09-10 | Discharge: 2019-09-10 | Disposition: A | Discharge status: Home / Self Care | Payer: BC Managed Care – PPO | Source: Ambulatory Visit | Attending: Orthopedic Surgery | Admitting: Orthopedic Surgery

## 2019-09-10 ENCOUNTER — Other Ambulatory Visit (HOSPITAL_COMMUNITY)
Admission: RE | Admit: 2019-09-10 | Discharge: 2019-09-10 | Disposition: A | Discharge status: Home / Self Care | Payer: BC Managed Care – PPO | Source: Ambulatory Visit | Attending: Orthopedic Surgery | Admitting: Orthopedic Surgery

## 2019-09-10 DIAGNOSIS — Z01818 Encounter for other preprocedural examination: Secondary | ICD-10-CM | POA: Insufficient documentation

## 2019-09-10 DIAGNOSIS — Z01812 Encounter for preprocedural laboratory examination: Secondary | ICD-10-CM | POA: Diagnosis present

## 2019-09-10 DIAGNOSIS — Z20822 Contact with and (suspected) exposure to covid-19: Secondary | ICD-10-CM | POA: Diagnosis not present

## 2019-09-10 LAB — BASIC METABOLIC PANEL
Anion gap: 9 (ref 5–15)
BUN: 12 mg/dL (ref 6–20)
CO2: 26 mmol/L (ref 22–32)
Calcium: 9.1 mg/dL (ref 8.9–10.3)
Chloride: 101 mmol/L (ref 98–111)
Creatinine, Ser: 0.96 mg/dL (ref 0.61–1.24)
GFR calc Af Amer: >60 mL/min (ref >60)
GFR calc non Af Amer: >60 mL/min (ref >60)
Glucose, Bld: 104 mg/dL — ABNORMAL HIGH (ref 70–99)
Potassium: 4.4 mmol/L (ref 3.5–5.1)
Sodium: 136 mmol/L (ref 135–145)

## 2019-09-10 LAB — SARS CORONAVIRUS 2 (TAT 6-24 HRS): SARS Coronavirus 2: NEGATIVE

## 2019-09-10 NOTE — Progress Notes (Signed)

## 2019-09-14 ENCOUNTER — Encounter (HOSPITAL_BASED_OUTPATIENT_CLINIC_OR_DEPARTMENT_OTHER): Payer: Self-pay | Admitting: Orthopedic Surgery

## 2019-09-14 ENCOUNTER — Ambulatory Visit (HOSPITAL_BASED_OUTPATIENT_CLINIC_OR_DEPARTMENT_OTHER): Payer: BC Managed Care – PPO | Admitting: Anesthesiology

## 2019-09-14 ENCOUNTER — Encounter (HOSPITAL_BASED_OUTPATIENT_CLINIC_OR_DEPARTMENT_OTHER)
Admission: RE | Disposition: A | Discharge status: Home / Self Care | Payer: Self-pay | Source: Home / Self Care | Attending: Orthopedic Surgery

## 2019-09-14 ENCOUNTER — Ambulatory Visit (HOSPITAL_BASED_OUTPATIENT_CLINIC_OR_DEPARTMENT_OTHER)
Admission: RE | Admit: 2019-09-14 | Discharge: 2019-09-14 | Disposition: A | Discharge status: Home / Self Care | Payer: BC Managed Care – PPO | Attending: Orthopedic Surgery | Admitting: Orthopedic Surgery

## 2019-09-14 DIAGNOSIS — Z9049 Acquired absence of other specified parts of digestive tract: Secondary | ICD-10-CM | POA: Insufficient documentation

## 2019-09-14 DIAGNOSIS — Z79899 Other long term (current) drug therapy: Secondary | ICD-10-CM | POA: Diagnosis not present

## 2019-09-14 DIAGNOSIS — I10 Essential (primary) hypertension: Secondary | ICD-10-CM | POA: Insufficient documentation

## 2019-09-14 DIAGNOSIS — K219 Gastro-esophageal reflux disease without esophagitis: Secondary | ICD-10-CM | POA: Diagnosis not present

## 2019-09-14 DIAGNOSIS — G5601 Carpal tunnel syndrome, right upper limb: Secondary | ICD-10-CM | POA: Diagnosis not present

## 2019-09-14 DIAGNOSIS — Z881 Allergy status to other antibiotic agents status: Secondary | ICD-10-CM | POA: Insufficient documentation

## 2019-09-14 HISTORY — DX: Gastro-esophageal reflux disease without esophagitis: K21.9

## 2019-09-14 HISTORY — DX: Essential (primary) hypertension: I10

## 2019-09-14 HISTORY — PX: CARPAL TUNNEL RELEASE: SHX101

## 2019-09-14 SURGERY — CARPAL TUNNEL RELEASE
Anesthesia: Monitor Anesthesia Care | Site: Hand | Laterality: Right

## 2019-09-14 MED ORDER — ONDANSETRON HCL 4 MG/2ML IJ SOLN
INTRAMUSCULAR | Status: AC
  Filled 2019-09-14: qty 2

## 2019-09-14 MED ORDER — FENTANYL CITRATE (PF) 100 MCG/2ML IJ SOLN: 25.0000 ug | INTRAMUSCULAR | Status: DC | PRN

## 2019-09-14 MED ORDER — BUPIVACAINE HCL (PF) 0.25 % IJ SOLN
INTRAMUSCULAR | Status: AC
  Filled 2019-09-14: qty 30

## 2019-09-14 MED ORDER — ONDANSETRON HCL 4 MG/2ML IJ SOLN: 4.0000 mg | Freq: Once | INTRAMUSCULAR | Status: DC | PRN

## 2019-09-14 MED ORDER — PROPOFOL 500 MG/50ML IV EMUL
INTRAVENOUS | Status: DC | PRN
Start: 2019-09-14 — End: 2019-09-14
  Administered 2019-09-14: 75 ug/kg/min via INTRAVENOUS

## 2019-09-14 MED ORDER — CEFAZOLIN SODIUM-DEXTROSE 2-4 GM/100ML-% IV SOLN
INTRAVENOUS | Status: AC
  Filled 2019-09-14: qty 100

## 2019-09-14 MED ORDER — LACTATED RINGERS IV SOLN: INTRAVENOUS | Status: DC

## 2019-09-14 MED ORDER — HYDROCODONE-ACETAMINOPHEN 5-325 MG PO TABS: ORAL_TABLET | ORAL | 0 refills | Status: DC

## 2019-09-14 MED ORDER — FENTANYL CITRATE (PF) 100 MCG/2ML IJ SOLN
INTRAMUSCULAR | Status: AC
  Filled 2019-09-14: qty 2

## 2019-09-14 MED ORDER — BUPIVACAINE HCL (PF) 0.25 % IJ SOLN
INTRAMUSCULAR | Status: DC | PRN
  Administered 2019-09-14: 10 mL

## 2019-09-14 MED ORDER — PROPOFOL 500 MG/50ML IV EMUL
INTRAVENOUS | Status: AC
  Filled 2019-09-14: qty 50

## 2019-09-14 MED ORDER — ONDANSETRON HCL 4 MG/2ML IJ SOLN
INTRAMUSCULAR | Status: DC | PRN
  Administered 2019-09-14: 4 mg via INTRAVENOUS

## 2019-09-14 MED ORDER — CEFAZOLIN SODIUM-DEXTROSE 2-4 GM/100ML-% IV SOLN
2.0000 g | INTRAVENOUS | Status: AC
  Administered 2019-09-14: 2 g via INTRAVENOUS

## 2019-09-14 MED ORDER — FENTANYL CITRATE (PF) 100 MCG/2ML IJ SOLN
INTRAMUSCULAR | Status: DC | PRN
  Administered 2019-09-14: 100 ug via INTRAVENOUS

## 2019-09-14 MED ORDER — LIDOCAINE HCL (PF) 0.5 % IJ SOLN
INTRAMUSCULAR | Status: DC | PRN
  Administered 2019-09-14: 30 mL via INTRAVENOUS

## 2019-09-14 MED ORDER — MIDAZOLAM HCL 2 MG/2ML IJ SOLN
INTRAMUSCULAR | Status: AC
  Filled 2019-09-14: qty 2

## 2019-09-14 MED ORDER — OXYCODONE HCL 5 MG/5ML PO SOLN: 5.0000 mg | Freq: Once | ORAL | Status: DC | PRN

## 2019-09-14 MED ORDER — MIDAZOLAM HCL 5 MG/5ML IJ SOLN
INTRAMUSCULAR | Status: DC | PRN
  Administered 2019-09-14: 2 mg via INTRAVENOUS

## 2019-09-14 MED ORDER — OXYCODONE HCL 5 MG PO TABS: 5.0000 mg | ORAL_TABLET | Freq: Once | ORAL | Status: DC | PRN

## 2019-09-14 SURGICAL SUPPLY — 39 items
APL PRP STRL LF DISP 70% ISPRP (MISCELLANEOUS) ×1
BLADE SURG 15 STRL LF DISP TIS (BLADE) ×2 IMPLANT
BLADE SURG 15 STRL SS (BLADE) ×4
BNDG CMPR 9X4 STRL LF SNTH (GAUZE/BANDAGES/DRESSINGS)
BNDG ELASTIC 3X5.8 VLCR STR LF (GAUZE/BANDAGES/DRESSINGS) ×2 IMPLANT
BNDG ESMARK 4X9 LF (GAUZE/BANDAGES/DRESSINGS) IMPLANT
BNDG GAUZE ELAST 4 BULKY (GAUZE/BANDAGES/DRESSINGS) ×2 IMPLANT
CHLORAPREP W/TINT 26 (MISCELLANEOUS) ×2 IMPLANT
CORD BIPOLAR FORCEPS 12FT (ELECTRODE) ×2 IMPLANT
COVER BACK TABLE 60X90IN (DRAPES) ×2 IMPLANT
COVER MAYO STAND STRL (DRAPES) ×2 IMPLANT
COVER WAND RF STERILE (DRAPES) IMPLANT
CUFF TOURN SGL QUICK 18X4 (TOURNIQUET CUFF) ×2 IMPLANT
DRAPE EXTREMITY T 121X128X90 (DISPOSABLE) ×2 IMPLANT
DRAPE SURG 17X23 STRL (DRAPES) ×2 IMPLANT
DRSG PAD ABDOMINAL 8X10 ST (GAUZE/BANDAGES/DRESSINGS) ×2 IMPLANT
GAUZE SPONGE 4X4 12PLY STRL (GAUZE/BANDAGES/DRESSINGS) ×2 IMPLANT
GAUZE XEROFORM 1X8 LF (GAUZE/BANDAGES/DRESSINGS) ×2 IMPLANT
GLOVE BIO SURGEON STRL SZ7.5 (GLOVE) ×2 IMPLANT
GLOVE BIOGEL PI IND STRL 7.0 (GLOVE) IMPLANT
GLOVE BIOGEL PI IND STRL 8 (GLOVE) ×1 IMPLANT
GLOVE BIOGEL PI INDICATOR 7.0 (GLOVE) ×2
GLOVE BIOGEL PI INDICATOR 8 (GLOVE) ×1
GLOVE ECLIPSE 6.5 STRL STRAW (GLOVE) ×1 IMPLANT
GOWN STRL REUS W/ TWL LRG LVL3 (GOWN DISPOSABLE) ×1 IMPLANT
GOWN STRL REUS W/TWL LRG LVL3 (GOWN DISPOSABLE) ×2
GOWN STRL REUS W/TWL XL LVL3 (GOWN DISPOSABLE) ×2 IMPLANT
NDL HYPO 25X1 1.5 SAFETY (NEEDLE) ×1 IMPLANT
NEEDLE HYPO 25X1 1.5 SAFETY (NEEDLE) ×2 IMPLANT
NS IRRIG 1000ML POUR BTL (IV SOLUTION) ×2 IMPLANT
PACK BASIN DAY SURGERY FS (CUSTOM PROCEDURE TRAY) ×2 IMPLANT
PADDING CAST ABS 4INX4YD NS (CAST SUPPLIES)
PADDING CAST ABS COTTON 4X4 ST (CAST SUPPLIES) ×1 IMPLANT
STOCKINETTE 4X48 STRL (DRAPES) ×2 IMPLANT
SUT ETHILON 4 0 PS 2 18 (SUTURE) ×2 IMPLANT
SYR BULB EAR ULCER 3OZ GRN STR (SYRINGE) ×2 IMPLANT
SYR CONTROL 10ML LL (SYRINGE) ×2 IMPLANT
TOWEL GREEN STERILE FF (TOWEL DISPOSABLE) ×4 IMPLANT
UNDERPAD 30X36 HEAVY ABSORB (UNDERPADS AND DIAPERS) ×2 IMPLANT

## 2019-09-14 NOTE — Discharge Instructions (Addendum)

## 2019-09-14 NOTE — Op Note (Signed)
09/14/2019 [facility]                              OPERATIVE REPORT   PREOPERATIVE DIAGNOSIS:  Right carpal tunnel syndrome.  POSTOPERATIVE DIAGNOSIS:  Right carpal tunnel syndrome.  PROCEDURE:  Right carpal tunnel release.  SURGEON:  Leanora Cover, MD  ASSISTANT:  none.  ANESTHESIA: Regional with sedation  IV FLUIDS:  Per anesthesia flow sheet.  ESTIMATED BLOOD LOSS:  Minimal.  COMPLICATIONS:  None.  SPECIMENS:  None.  TOURNIQUET TIME:    Total Tourniquet Time Documented: Forearm (Right) - 23 minutes Total: Forearm (Right) - 23 minutes   DISPOSITION:  Stable to PACU.  LOCATION: [facility]  INDICATIONS:  49 yo male with numbness and tingling right hand.  Nocturnal symptoms.  Positive nerve conduction studies.  He wishes to have a carpal tunnel release for management of his symptoms.  Risks, benefits and alternatives of surgery were discussed including the risk of blood loss; infection; damage to nerves, vessels, tendons, ligaments, bone; failure of surgery; need for additional surgery; complications with wound healing; continued pain; recurrence of carpal tunnel syndrome; and damage to motor branch. He voiced understanding of these risks and elected to proceed.   OPERATIVE COURSE:  After being identified preoperatively by myself, the patient and I agreed upon the procedure and site of procedure.  The surgical site was marked.  The risks, benefits, and alternatives of the surgery were reviewed and he wished to proceed.  Surgical consent had been signed.  He was given IV Ancef as preoperative antibiotic prophylaxis.  He was transferred to the operating room and placed on the operating room table in supine position with the Right upper extremity on an armboard.  Bier block anesthesia was induced by the anesthesiologist.  Right upper extremity was prepped and draped in normal sterile orthopaedic fashion.  A surgical pause was performed between the  surgeons, anesthesia, and operating room staff, and all were in agreement as to the patient, procedure, and site of procedure.  Tourniquet at the proximal aspect of the forearm had been inflated for the Bier block  Incision was made over the transverse carpal ligament and carried into the subcutaneous tissues by spreading technique.  Bipolar electrocautery was used to obtain hemostasis.  The palmar fascia was sharply incised.  The transverse carpal ligament was identified and sharply incised.  It was incised distally first.  The flexor tendons were identified.  The flexor tendon to the ring finger was identified and retracted radially.  The transverse carpal ligament was then incised proximally.  Scissors were used to split the distal aspect of the volar antebrachial fascia.  A finger was placed into the wound to ensure complete decompression, which was the case.  The nerve was examined.  It was flattened and hyperemic.  The motor branch was identified and was intact.  The wound was copiously irrigated with sterile saline.  It was then closed with 4-0 nylon in a horizontal mattress fashion.  It was injected with 0.25% plain Marcaine to aid in postoperative analgesia.  It was dressed with sterile Xeroform, 4x4s, an ABD, and wrapped with Kerlix and an Ace bandage.  Tourniquet was deflated at 23 minutes.  Fingertips were pink with brisk capillary refill after deflation of the tourniquet.  Operative drapes were broken down.  The patient was awoken from anesthesia safely.  He was transferred back to stretcher and taken to the  PACU in stable condition.  I will see him back in the office in 1 week for postoperative followup.  I will give him a prescription for Norco 5/325 1-2 tabs PO q6 hours prn pain, dispense # 20.    Leanora Cover, MD Electronically signed, 09/14/19

## 2019-09-14 NOTE — Anesthesia Procedure Notes (Signed)
Anesthesia Regional Block: Bier block (IV Regional)   Pre-Anesthetic Checklist: ,, timeout performed, Correct Patient, Correct Site, Correct Laterality, Correct Procedure,, site marked, surgical consent,, at surgeon's request  Laterality: Right     Needles:  Injection technique: Single-shot  Needle Type: Other      Needle Gauge: 22     Additional Needles:   Procedures:,,,,, intact distal pulses, Esmarch exsanguination, single tourniquet utilized,  Narrative:  Start time: 09/14/2019 1:19 PM End time: 09/14/2019 1:20 PM  Performed by: Personally

## 2019-09-14 NOTE — Anesthesia Postprocedure Evaluation (Signed)
Anesthesia Post Note  Patient: Anthony Lin  Procedure(s) Performed: RIGHT CARPAL TUNNEL RELEASE (Right Hand)     Anesthesia Post Evaluation No complications documented.  Last Vitals:  Vitals:   09/14/19 1411 09/14/19 1423  BP: 116/70 113/70  Pulse: 79 79  Resp: 14 16  Temp:  36.8 C  SpO2: 97% 97%    Last Pain:  Vitals:   09/14/19 1423  TempSrc:   PainSc: 0-No pain                 Esra Frankowski COKER

## 2019-09-14 NOTE — Transfer of Care (Signed)
Immediate Anesthesia Transfer of Care Note  Patient: Anthony Lin  Procedure(s) Performed: RIGHT CARPAL TUNNEL RELEASE (Right Hand)  Patient Location: PACU  Anesthesia Type:MAC and Bier block  Level of Consciousness: awake, alert  and oriented  Airway & Oxygen Therapy: Patient Spontanous Breathing and Patient connected to face mask oxygen  Post-op Assessment: Report given to RN and Post -op Vital signs reviewed and stable  Post vital signs: Reviewed and stable  Last Vitals:  Vitals Value Taken Time  BP 115/72 09/14/19 1351  Temp    Pulse 86 09/14/19 1352  Resp 12 09/14/19 1352  SpO2 98 % 09/14/19 1352  Vitals shown include unvalidated device data.  Last Pain:  Vitals:   09/14/19 1227  TempSrc: Oral  PainSc: 0-No pain         Complications: No complications documented.

## 2019-09-14 NOTE — H&P (Signed)
Anthony Lin is an 49 y.o. male.   Chief Complaint: carpal tunnel syndrome HPI: 49 yo male with numbness and tinging right hand.  Nocturnal symptoms.  Positive nerve conduction studies.  He wishes to have right carpal tunnel release.  Allergies:  Allergies  Allergen Reactions  . Zithromax [Azithromycin] Palpitations    Past Medical History:  Diagnosis Date  . GERD (gastroesophageal reflux disease)   . Hypertension     Past Surgical History:  Procedure Laterality Date  . APPENDECTOMY    . CHOLECYSTECTOMY    . TONSILLECTOMY      Family History: History reviewed. No pertinent family history.  Social History:   reports that he has never smoked. He has never used smokeless tobacco. He reports that he does not drink alcohol and does not use drugs.  Medications: Medications Prior to Admission  Medication Sig Dispense Refill  . lisinopril-hydrochlorothiazide (ZESTORETIC) 20-12.5 MG tablet Take 1 tablet by mouth daily.    Marland Kitchen omeprazole (PRILOSEC) 20 MG capsule Take 20 mg by mouth daily.      No results found for this or any previous visit (from the past 48 hour(s)).  No results found.   A comprehensive review of systems was negative.  Blood pressure 125/74, pulse 91, temperature 98.7 F (37.1 C), temperature source Oral, resp. rate 16, height 6' (1.829 m), weight 125 kg, SpO2 98 %.  General appearance: alert, cooperative and appears stated age Head: Normocephalic, without obvious abnormality, atraumatic Neck: supple, symmetrical, trachea midline Cardio: regular rate and rhythm Resp: clear to auscultation bilaterally Extremities: Intact sensation and capillary refill all digits.  +epl/fpl/io.  No wounds.  Pulses: 2+ and symmetric Skin: Skin color, texture, turgor normal. No rashes or lesions Neurologic: Grossly normal Incision/Wound: none  Assessment/Plan Right carpal tunnel syndrome.  Non operative and operative treatment options have been discussed with the patient  and patient wishes to proceed with operative treatment. Risks, benefits, and alternatives of surgery have been discussed and the patient agrees with the plan of care.   Leanora Cover 09/14/2019, 12:47 PM

## 2019-09-14 NOTE — Anesthesia Preprocedure Evaluation (Signed)
Anesthesia Evaluation  Patient identified by MRN, date of birth, ID band Patient awake    Reviewed: Allergy & Precautions, NPO status , Patient's Chart, lab work & pertinent test results  Airway Mallampati: III  TM Distance: >3 FB Neck ROM: Full    Dental  (+) Teeth Intact, Dental Advisory Given   Pulmonary    breath sounds clear to auscultation       Cardiovascular hypertension,  Rhythm:Regular Rate:Normal     Neuro/Psych    GI/Hepatic   Endo/Other    Renal/GU      Musculoskeletal   Abdominal   Peds  Hematology   Anesthesia Other Findings   Reproductive/Obstetrics                             Anesthesia Physical Anesthesia Plan  ASA: III  Anesthesia Plan: MAC and Bier Block and Bier Block-LIDOCAINE ONLY   Post-op Pain Management:    Induction: Intravenous  PONV Risk Score and Plan: Propofol infusion and Ondansetron  Airway Management Planned: Natural Airway and Simple Face Mask  Additional Equipment:   Intra-op Plan:   Post-operative Plan:   Informed Consent: I have reviewed the patients History and Physical, chart, labs and discussed the procedure including the risks, benefits and alternatives for the proposed anesthesia with the patient or authorized representative who has indicated his/her understanding and acceptance.     Dental advisory given  Plan Discussed with: CRNA and Anesthesiologist  Anesthesia Plan Comments:         Anesthesia Quick Evaluation

## 2019-09-15 ENCOUNTER — Encounter (HOSPITAL_BASED_OUTPATIENT_CLINIC_OR_DEPARTMENT_OTHER): Payer: Self-pay | Admitting: Orthopedic Surgery

## 2019-09-30 ENCOUNTER — Encounter (HOSPITAL_BASED_OUTPATIENT_CLINIC_OR_DEPARTMENT_OTHER): Payer: Self-pay | Admitting: Orthopedic Surgery

## 2019-09-30 ENCOUNTER — Other Ambulatory Visit: Payer: Self-pay

## 2019-09-30 ENCOUNTER — Other Ambulatory Visit: Payer: Self-pay | Admitting: Orthopedic Surgery

## 2019-10-01 ENCOUNTER — Encounter (HOSPITAL_BASED_OUTPATIENT_CLINIC_OR_DEPARTMENT_OTHER)
Admission: RE | Admit: 2019-10-01 | Discharge: 2019-10-01 | Disposition: A | Discharge status: Home / Self Care | Payer: BC Managed Care – PPO | Source: Ambulatory Visit | Attending: Orthopedic Surgery | Admitting: Orthopedic Surgery

## 2019-10-02 ENCOUNTER — Encounter (HOSPITAL_BASED_OUTPATIENT_CLINIC_OR_DEPARTMENT_OTHER)
Admission: RE | Admit: 2019-10-02 | Discharge: 2019-10-02 | Disposition: A | Discharge status: Home / Self Care | Payer: BC Managed Care – PPO | Source: Ambulatory Visit | Attending: Orthopedic Surgery | Admitting: Orthopedic Surgery

## 2019-10-02 ENCOUNTER — Other Ambulatory Visit (HOSPITAL_COMMUNITY)
Admission: RE | Admit: 2019-10-02 | Discharge: 2019-10-02 | Disposition: A | Discharge status: Home / Self Care | Payer: BC Managed Care – PPO | Source: Ambulatory Visit | Attending: Orthopedic Surgery | Admitting: Orthopedic Surgery

## 2019-10-02 DIAGNOSIS — K219 Gastro-esophageal reflux disease without esophagitis: Secondary | ICD-10-CM | POA: Diagnosis not present

## 2019-10-02 DIAGNOSIS — Z881 Allergy status to other antibiotic agents status: Secondary | ICD-10-CM | POA: Diagnosis not present

## 2019-10-02 DIAGNOSIS — Z20822 Contact with and (suspected) exposure to covid-19: Secondary | ICD-10-CM | POA: Diagnosis not present

## 2019-10-02 DIAGNOSIS — G5602 Carpal tunnel syndrome, left upper limb: Secondary | ICD-10-CM | POA: Diagnosis present

## 2019-10-02 DIAGNOSIS — Z01812 Encounter for preprocedural laboratory examination: Secondary | ICD-10-CM | POA: Insufficient documentation

## 2019-10-02 DIAGNOSIS — Z79899 Other long term (current) drug therapy: Secondary | ICD-10-CM | POA: Diagnosis not present

## 2019-10-02 DIAGNOSIS — Z9049 Acquired absence of other specified parts of digestive tract: Secondary | ICD-10-CM | POA: Diagnosis not present

## 2019-10-02 DIAGNOSIS — I10 Essential (primary) hypertension: Secondary | ICD-10-CM | POA: Diagnosis not present

## 2019-10-02 LAB — BASIC METABOLIC PANEL
Anion gap: 9 (ref 5–15)
BUN: 10 mg/dL (ref 6–20)
CO2: 28 mmol/L (ref 22–32)
Calcium: 9.2 mg/dL (ref 8.9–10.3)
Chloride: 98 mmol/L (ref 98–111)
Creatinine, Ser: 1.06 mg/dL (ref 0.61–1.24)
GFR calc Af Amer: >60 mL/min (ref >60)
GFR calc non Af Amer: >60 mL/min (ref >60)
Glucose, Bld: 111 mg/dL — ABNORMAL HIGH (ref 70–99)
Potassium: 4.5 mmol/L (ref 3.5–5.1)
Sodium: 135 mmol/L (ref 135–145)

## 2019-10-02 LAB — SARS CORONAVIRUS 2 (TAT 6-24 HRS): SARS Coronavirus 2: NEGATIVE

## 2019-10-02 NOTE — Progress Notes (Signed)

## 2019-10-05 ENCOUNTER — Ambulatory Visit (HOSPITAL_BASED_OUTPATIENT_CLINIC_OR_DEPARTMENT_OTHER): Payer: BC Managed Care – PPO | Admitting: Anesthesiology

## 2019-10-05 ENCOUNTER — Encounter (HOSPITAL_BASED_OUTPATIENT_CLINIC_OR_DEPARTMENT_OTHER)
Admission: RE | Disposition: A | Discharge status: Home / Self Care | Payer: Self-pay | Source: Home / Self Care | Attending: Orthopedic Surgery

## 2019-10-05 ENCOUNTER — Other Ambulatory Visit: Payer: Self-pay

## 2019-10-05 ENCOUNTER — Ambulatory Visit (HOSPITAL_BASED_OUTPATIENT_CLINIC_OR_DEPARTMENT_OTHER)
Admission: RE | Admit: 2019-10-05 | Discharge: 2019-10-05 | Disposition: A | Discharge status: Home / Self Care | Payer: BC Managed Care – PPO | Attending: Orthopedic Surgery | Admitting: Orthopedic Surgery

## 2019-10-05 ENCOUNTER — Encounter (HOSPITAL_BASED_OUTPATIENT_CLINIC_OR_DEPARTMENT_OTHER): Payer: Self-pay | Admitting: Orthopedic Surgery

## 2019-10-05 DIAGNOSIS — I10 Essential (primary) hypertension: Secondary | ICD-10-CM | POA: Insufficient documentation

## 2019-10-05 DIAGNOSIS — G5602 Carpal tunnel syndrome, left upper limb: Secondary | ICD-10-CM | POA: Insufficient documentation

## 2019-10-05 DIAGNOSIS — Z881 Allergy status to other antibiotic agents status: Secondary | ICD-10-CM | POA: Insufficient documentation

## 2019-10-05 DIAGNOSIS — Z79899 Other long term (current) drug therapy: Secondary | ICD-10-CM | POA: Insufficient documentation

## 2019-10-05 DIAGNOSIS — Z9049 Acquired absence of other specified parts of digestive tract: Secondary | ICD-10-CM | POA: Insufficient documentation

## 2019-10-05 DIAGNOSIS — K219 Gastro-esophageal reflux disease without esophagitis: Secondary | ICD-10-CM | POA: Insufficient documentation

## 2019-10-05 HISTORY — PX: CARPAL TUNNEL RELEASE: SHX101

## 2019-10-05 SURGERY — CARPAL TUNNEL RELEASE
Anesthesia: Monitor Anesthesia Care | Site: Wrist | Laterality: Left

## 2019-10-05 MED ORDER — HYDROCODONE-ACETAMINOPHEN 5-325 MG PO TABS
ORAL_TABLET | ORAL | 0 refills | Status: DC
Start: 2019-10-05 — End: 2020-03-21

## 2019-10-05 MED ORDER — PROPOFOL 500 MG/50ML IV EMUL
INTRAVENOUS | Status: DC | PRN
  Administered 2019-10-05: 75 ug/kg/min via INTRAVENOUS

## 2019-10-05 MED ORDER — LIDOCAINE HCL (PF) 0.5 % IJ SOLN
INTRAMUSCULAR | Status: DC | PRN
  Administered 2019-10-05: 30 mL via INTRAVENOUS

## 2019-10-05 MED ORDER — LACTATED RINGERS IV SOLN: INTRAVENOUS | Status: DC

## 2019-10-05 MED ORDER — BUPIVACAINE HCL (PF) 0.25 % IJ SOLN
INTRAMUSCULAR | Status: DC | PRN
  Administered 2019-10-05: 9 mL

## 2019-10-05 MED ORDER — MIDAZOLAM HCL 5 MG/5ML IJ SOLN
INTRAMUSCULAR | Status: DC | PRN
  Administered 2019-10-05: 2 mg via INTRAVENOUS

## 2019-10-05 MED ORDER — FENTANYL CITRATE (PF) 100 MCG/2ML IJ SOLN
INTRAMUSCULAR | Status: AC
  Filled 2019-10-05: qty 2

## 2019-10-05 MED ORDER — PROMETHAZINE HCL 25 MG/ML IJ SOLN: 6.2500 mg | INTRAMUSCULAR | Status: DC | PRN

## 2019-10-05 MED ORDER — ONDANSETRON HCL 4 MG/2ML IJ SOLN
INTRAMUSCULAR | Status: AC
  Filled 2019-10-05: qty 2

## 2019-10-05 MED ORDER — OXYCODONE HCL 5 MG PO TABS: 5.0000 mg | ORAL_TABLET | Freq: Once | ORAL | Status: DC | PRN

## 2019-10-05 MED ORDER — OXYCODONE HCL 5 MG/5ML PO SOLN: 5.0000 mg | Freq: Once | ORAL | Status: DC | PRN

## 2019-10-05 MED ORDER — CEFAZOLIN SODIUM-DEXTROSE 1-4 GM/50ML-% IV SOLN
INTRAVENOUS | Status: AC
  Filled 2019-10-05: qty 50

## 2019-10-05 MED ORDER — ONDANSETRON HCL 4 MG/2ML IJ SOLN
INTRAMUSCULAR | Status: DC | PRN
  Administered 2019-10-05: 4 mg via INTRAVENOUS

## 2019-10-05 MED ORDER — CEFAZOLIN SODIUM-DEXTROSE 2-4 GM/100ML-% IV SOLN
2.0000 g | INTRAVENOUS | Status: AC
  Administered 2019-10-05: 3 g via INTRAVENOUS

## 2019-10-05 MED ORDER — MIDAZOLAM HCL 2 MG/2ML IJ SOLN
INTRAMUSCULAR | Status: AC
  Filled 2019-10-05: qty 2

## 2019-10-05 MED ORDER — HYDROMORPHONE HCL 1 MG/ML IJ SOLN: 0.2500 mg | INTRAMUSCULAR | Status: DC | PRN

## 2019-10-05 MED ORDER — CEFAZOLIN SODIUM-DEXTROSE 2-4 GM/100ML-% IV SOLN
INTRAVENOUS | Status: AC
  Filled 2019-10-05: qty 100

## 2019-10-05 MED ORDER — PROPOFOL 500 MG/50ML IV EMUL
INTRAVENOUS | Status: AC
  Filled 2019-10-05: qty 50

## 2019-10-05 MED ORDER — LIDOCAINE 2% (20 MG/ML) 5 ML SYRINGE
INTRAMUSCULAR | Status: AC
  Filled 2019-10-05: qty 5

## 2019-10-05 MED ORDER — FENTANYL CITRATE (PF) 100 MCG/2ML IJ SOLN
INTRAMUSCULAR | Status: DC | PRN
  Administered 2019-10-05: 100 ug via INTRAVENOUS

## 2019-10-05 SURGICAL SUPPLY — 39 items
APL PRP STRL LF DISP 70% ISPRP (MISCELLANEOUS) ×1
BLADE SURG 15 STRL LF DISP TIS (BLADE) ×2 IMPLANT
BLADE SURG 15 STRL SS (BLADE) ×4
BNDG CMPR 9X4 STRL LF SNTH (GAUZE/BANDAGES/DRESSINGS) ×1
BNDG ELASTIC 3X5.8 VLCR STR LF (GAUZE/BANDAGES/DRESSINGS) ×2 IMPLANT
BNDG ESMARK 4X9 LF (GAUZE/BANDAGES/DRESSINGS) ×1 IMPLANT
BNDG GAUZE ELAST 4 BULKY (GAUZE/BANDAGES/DRESSINGS) ×2 IMPLANT
CHLORAPREP W/TINT 26 (MISCELLANEOUS) ×2 IMPLANT
CORD BIPOLAR FORCEPS 12FT (ELECTRODE) ×2 IMPLANT
COVER BACK TABLE 60X90IN (DRAPES) ×2 IMPLANT
COVER MAYO STAND STRL (DRAPES) ×2 IMPLANT
COVER WAND RF STERILE (DRAPES) IMPLANT
CUFF TOURN SGL QUICK 18X4 (TOURNIQUET CUFF) ×2 IMPLANT
DRAPE EXTREMITY T 121X128X90 (DISPOSABLE) ×2 IMPLANT
DRAPE SURG 17X23 STRL (DRAPES) ×2 IMPLANT
DRSG PAD ABDOMINAL 8X10 ST (GAUZE/BANDAGES/DRESSINGS) ×2 IMPLANT
GAUZE SPONGE 4X4 12PLY STRL (GAUZE/BANDAGES/DRESSINGS) ×2 IMPLANT
GAUZE XEROFORM 1X8 LF (GAUZE/BANDAGES/DRESSINGS) ×2 IMPLANT
GLOVE BIO SURGEON STRL SZ7.5 (GLOVE) ×2 IMPLANT
GLOVE BIOGEL PI IND STRL 7.0 (GLOVE) IMPLANT
GLOVE BIOGEL PI IND STRL 8 (GLOVE) ×1 IMPLANT
GLOVE BIOGEL PI INDICATOR 7.0 (GLOVE) ×2
GLOVE BIOGEL PI INDICATOR 8 (GLOVE) ×1
GLOVE ECLIPSE 7.0 STRL STRAW (GLOVE) ×1 IMPLANT
GOWN STRL REUS W/ TWL LRG LVL3 (GOWN DISPOSABLE) ×1 IMPLANT
GOWN STRL REUS W/TWL LRG LVL3 (GOWN DISPOSABLE) ×2
GOWN STRL REUS W/TWL XL LVL3 (GOWN DISPOSABLE) ×2 IMPLANT
NDL HYPO 25X1 1.5 SAFETY (NEEDLE) ×1 IMPLANT
NEEDLE HYPO 25X1 1.5 SAFETY (NEEDLE) ×2 IMPLANT
NS IRRIG 1000ML POUR BTL (IV SOLUTION) ×2 IMPLANT
PACK BASIN DAY SURGERY FS (CUSTOM PROCEDURE TRAY) ×2 IMPLANT
PADDING CAST ABS 4INX4YD NS (CAST SUPPLIES)
PADDING CAST ABS COTTON 4X4 ST (CAST SUPPLIES) ×1 IMPLANT
STOCKINETTE 4X48 STRL (DRAPES) ×2 IMPLANT
SUT ETHILON 4 0 PS 2 18 (SUTURE) ×2 IMPLANT
SYR BULB EAR ULCER 3OZ GRN STR (SYRINGE) ×2 IMPLANT
SYR CONTROL 10ML LL (SYRINGE) ×2 IMPLANT
TOWEL GREEN STERILE FF (TOWEL DISPOSABLE) ×4 IMPLANT
UNDERPAD 30X36 HEAVY ABSORB (UNDERPADS AND DIAPERS) ×2 IMPLANT

## 2019-10-05 NOTE — Transfer of Care (Signed)
Immediate Anesthesia Transfer of Care Note  Patient: Anthony Lin  Procedure(s) Performed: CARPAL TUNNEL RELEASE (Left Wrist)  Patient Location: PACU  Anesthesia Type:MAC and Bier block  Level of Consciousness: drowsy, patient cooperative and responds to stimulation  Airway & Oxygen Therapy: Patient Spontanous Breathing and Patient connected to face mask oxygen  Post-op Assessment: Report given to RN and Post -op Vital signs reviewed and stable  Post vital signs: Reviewed and stable  Last Vitals:  Vitals Value Taken Time  BP 114/72 10/05/19 1454  Temp    Pulse 86 10/05/19 1455  Resp 13 10/05/19 1455  SpO2 99 % 10/05/19 1455  Vitals shown include unvalidated device data.  Last Pain:  Vitals:   10/05/19 1249  TempSrc: Oral  PainSc: 0-No pain         Complications: No complications documented.

## 2019-10-05 NOTE — Anesthesia Preprocedure Evaluation (Addendum)
Anesthesia Evaluation  Patient identified by MRN, date of birth, ID band Patient awake    Reviewed: Allergy & Precautions, NPO status , Patient's Chart, lab work & pertinent test results  Airway Mallampati: III  TM Distance: >3 FB Neck ROM: Full    Dental no notable dental hx. (+) Teeth Intact, Dental Advisory Given   Pulmonary    Pulmonary exam normal breath sounds clear to auscultation       Cardiovascular hypertension, Pt. on medications Normal cardiovascular exam Rhythm:Regular Rate:Normal     Neuro/Psych    GI/Hepatic GERD  ,  Endo/Other    Renal/GU      Musculoskeletal   Abdominal (+) + obese,   Peds  Hematology   Anesthesia Other Findings   Reproductive/Obstetrics                             Anesthesia Physical  Anesthesia Plan  ASA: III  Anesthesia Plan: MAC and Bier Block and Bier Block-LIDOCAINE ONLY   Post-op Pain Management:    Induction: Intravenous  PONV Risk Score and Plan: 1 and Propofol infusion, Ondansetron and Treatment may vary due to age or medical condition  Airway Management Planned: Natural Airway and Simple Face Mask  Additional Equipment:   Intra-op Plan:   Post-operative Plan:   Informed Consent: I have reviewed the patients History and Physical, chart, labs and discussed the procedure including the risks, benefits and alternatives for the proposed anesthesia with the patient or authorized representative who has indicated his/her understanding and acceptance.     Dental advisory given  Plan Discussed with: CRNA and Anesthesiologist  Anesthesia Plan Comments:         Anesthesia Quick Evaluation

## 2019-10-05 NOTE — Anesthesia Procedure Notes (Signed)
Date/Time: 10/05/2019 2:17 PM Performed by: Glory Buff, CRNA Oxygen Delivery Method: Simple face mask

## 2019-10-05 NOTE — Anesthesia Procedure Notes (Signed)
Anesthesia Regional Block: Bier block (IV Regional)   Pre-Anesthetic Checklist: ,, timeout performed, Correct Patient, Correct Site, Correct Laterality, Correct Procedure, Correct Position, site marked, Risks and benefits discussed, Surgical consent,  Pre-op evaluation,  At surgeon's request  Laterality: Left     Needles:  Injection technique: Single-shot  Needle Type: Other   (22 g PIV)    Needle Gauge: 22     Additional Needles:   Procedures:,,,,,, Esmarch exsanguination, single tourniquet utilized,  Narrative:  Start time: 10/05/2019 2:23 PM End time: 10/05/2019 2:23 PM CRNA: Glory Buff, CRNA

## 2019-10-05 NOTE — Anesthesia Postprocedure Evaluation (Signed)
Anesthesia Post Note  Patient: Anthony Lin  Procedure(s) Performed: CARPAL TUNNEL RELEASE (Left Wrist)     Patient location during evaluation: PACU Anesthesia Type: MAC Level of consciousness: awake and alert Pain management: pain level controlled Vital Signs Assessment: post-procedure vital signs reviewed and stable Respiratory status: spontaneous breathing, nonlabored ventilation and respiratory function stable Cardiovascular status: blood pressure returned to baseline and stable Postop Assessment: no apparent nausea or vomiting Anesthetic complications: no   No complications documented.  Last Vitals:  Vitals:   10/05/19 1517 10/05/19 1518  BP:    Pulse: 84 89  Resp: 16 18  Temp:    SpO2: 94% 94%    Last Pain:  Vitals:   10/05/19 1515  TempSrc:   PainSc: 0-No pain                 Lynda Rainwater

## 2019-10-05 NOTE — H&P (Signed)
Anthony Lin is an 49 y.o. male.   Chief Complaint: carpal tunnel syndrome HPI: 49 yo male with numbness and tingling left hand.  Positive nerve conduction studies.  Nocturnal symptoms.  He wishes to have left carpal tunnel release.  Allergies:  Allergies  Allergen Reactions  . Zithromax [Azithromycin] Palpitations    Past Medical History:  Diagnosis Date  . GERD (gastroesophageal reflux disease)   . Hypertension     Past Surgical History:  Procedure Laterality Date  . APPENDECTOMY    . CARPAL TUNNEL RELEASE Right 09/14/2019   Procedure: RIGHT CARPAL TUNNEL RELEASE;  Surgeon: Leanora Cover, MD;  Location: [facility];  Service: Orthopedics;  Laterality: Right;  Bier block  . CHOLECYSTECTOMY    . TONSILLECTOMY      Family History: History reviewed. No pertinent family history.  Social History:   reports that he has never smoked. He has never used smokeless tobacco. He reports that he does not drink alcohol and does not use drugs.  Medications: Medications Prior to Admission  Medication Sig Dispense Refill  . HYDROcodone-acetaminophen (NORCO) 5-325 MG tablet 1-2 tabs po q6 hours prn pain 20 tablet 0  . lisinopril-hydrochlorothiazide (ZESTORETIC) 20-12.5 MG tablet Take 1 tablet by mouth daily.    Marland Kitchen omeprazole (PRILOSEC) 20 MG capsule Take 20 mg by mouth daily.      No results found for this or any previous visit (from the past 48 hour(s)).  No results found.   A comprehensive review of systems was negative.  Height 6' (1.829 m), weight 122.5 kg.  General appearance: alert, cooperative and appears stated age Head: Normocephalic, without obvious abnormality, atraumatic Neck: supple, symmetrical, trachea midline Cardio: regular rate and rhythm Resp: clear to auscultation bilaterally Extremities: Intact sensation and capillary refill all digits.  +epl/fpl/io.  No wounds.  Pulses: 2+ and symmetric Skin: Skin color, texture, turgor normal. No rashes or  lesions Neurologic: Grossly normal Incision/Wound: none  Assessment/Plan Left carpal tunnel syndrome.  Non operative and operative treatment options have been discussed with the patient and patient wishes to proceed with operative treatment. Risks, benefits, and alternatives of surgery have been discussed and the patient agrees with the plan of care.   Leanora Cover 10/05/2019, 12:36 PM

## 2019-10-05 NOTE — Discharge Instructions (Addendum)

## 2019-10-05 NOTE — Op Note (Signed)
10/05/2019 [facility]                              OPERATIVE REPORT   PREOPERATIVE DIAGNOSIS:  Left carpal tunnel syndrome.  POSTOPERATIVE DIAGNOSIS:  Left carpal tunnel syndrome.  PROCEDURE:  Left carpal tunnel release.  SURGEON:  Leanora Cover, MD  ASSISTANT:  none.  ANESTHESIA: Bier block with sedation  IV FLUIDS:  Per anesthesia flow sheet.  ESTIMATED BLOOD LOSS:  Minimal.  COMPLICATIONS:  None.  SPECIMENS:  None.  TOURNIQUET TIME:    Total Tourniquet Time Documented: Forearm (Left) - 23 minutes Total: Forearm (Left) - 23 minutes   DISPOSITION:  Stable to PACU.  LOCATION: [facility]  INDICATIONS:  49 yo male with numbness and tingling left hand.  Positive nerve conduction studies.  He wishes to have a carpal tunnel release for management of his symptoms.  Risks, benefits and alternatives of surgery were discussed including the risk of blood loss; infection; damage to nerves, vessels, tendons, ligaments, bone; failure of surgery; need for additional surgery; complications with wound healing; continued pain; recurrence of carpal tunnel syndrome; and damage to motor branch. He voiced understanding of these risks and elected to proceed.   OPERATIVE COURSE:  After being identified preoperatively by myself, the patient and I agreed upon the procedure and site of procedure.  The surgical site was marked.  The risks, benefits, and alternatives of the surgery were reviewed and he wished to proceed.  Surgical consent had been signed.  He was given IV Ancef as preoperative antibiotic prophylaxis.  He was transferred to the operating room and placed on the operating room table in supine position with the Left upper extremity on an armboard.  Bier block anesthesia was induced by the anesthesiologist.  Left upper extremity was prepped and draped in normal sterile orthopaedic fashion.  A surgical pause was performed between the surgeons, anesthesia, and  operating room staff, and all were in agreement as to the patient, procedure, and site of procedure.  Tourniquet at the proximal aspect of the forearm had been inflated for the Bier block  Incision was made over the transverse carpal ligament and carried into the subcutaneous tissues by spreading technique.  Bipolar electrocautery was used to obtain hemostasis.  The palmar fascia was sharply incised.  The transverse carpal ligament was identified and sharply incised.  It was incised distally first.  The flexor tendons were identified.  The flexor tendon to the ring finger was identified and retracted radially.  The transverse carpal ligament was then incised proximally.  Scissors were used to split the distal aspect of the volar antebrachial fascia.  A finger was placed into the wound to ensure complete decompression, which was the case.  The nerve was examined.  It was flattened and hyperemic.  The motor branch was identified and was intact.  The wound was copiously irrigated with sterile saline.  It was then closed with 4-0 nylon in a horizontal mattress fashion.  It was injected with 0.25% plain Marcaine to aid in postoperative analgesia.  It was dressed with sterile Xeroform, 4x4s, an ABD, and wrapped with Kerlix and an Ace bandage.  Tourniquet was deflated at 23 minutes.  Fingertips were pink with brisk capillary refill after deflation of the tourniquet.  Operative drapes were broken down.  The patient was awoken from anesthesia safely.  He was transferred back to stretcher and taken to the PACU in  stable condition.  I will see him back in the office in 1 week for postoperative followup.  I will give him a prescription for Norco 5/325 1-2 tabs PO q6 hours prn pain, dispense # 20.    Leanora Cover, MD Electronically signed, 10/05/19

## 2019-10-06 ENCOUNTER — Encounter (HOSPITAL_BASED_OUTPATIENT_CLINIC_OR_DEPARTMENT_OTHER): Payer: Self-pay | Admitting: Orthopedic Surgery

## 2019-12-22 ENCOUNTER — Ambulatory Visit (INDEPENDENT_AMBULATORY_CARE_PROVIDER_SITE_OTHER)
Admission: EM | Admit: 2019-12-22 | Discharge: 2019-12-22 | Disposition: A | Discharge status: Home / Self Care | Payer: BC Managed Care – PPO | Source: Home / Self Care

## 2019-12-22 ENCOUNTER — Other Ambulatory Visit: Payer: Self-pay

## 2019-12-22 ENCOUNTER — Emergency Department (HOSPITAL_COMMUNITY)
Admission: EM | Admit: 2019-12-22 | Discharge: 2019-12-22 | Disposition: A | Discharge status: Home / Self Care | Payer: BC Managed Care – PPO | Attending: Emergency Medicine | Admitting: Emergency Medicine

## 2019-12-22 DIAGNOSIS — I10 Essential (primary) hypertension: Secondary | ICD-10-CM | POA: Insufficient documentation

## 2019-12-22 DIAGNOSIS — K219 Gastro-esophageal reflux disease without esophagitis: Secondary | ICD-10-CM | POA: Insufficient documentation

## 2019-12-22 DIAGNOSIS — R1013 Epigastric pain: Secondary | ICD-10-CM | POA: Diagnosis not present

## 2019-12-22 DIAGNOSIS — R1011 Right upper quadrant pain: Secondary | ICD-10-CM | POA: Insufficient documentation

## 2019-12-22 DIAGNOSIS — Z79899 Other long term (current) drug therapy: Secondary | ICD-10-CM | POA: Diagnosis not present

## 2019-12-22 LAB — COMPREHENSIVE METABOLIC PANEL
ALT: 37 U/L (ref 0–44)
AST: 29 U/L (ref 15–41)
Albumin: 3.9 g/dL (ref 3.5–5.0)
Alkaline Phosphatase: 49 U/L (ref 38–126)
Anion gap: 9 (ref 5–15)
BUN: 17 mg/dL (ref 6–20)
CO2: 28 mmol/L (ref 22–32)
Calcium: 9 mg/dL (ref 8.9–10.3)
Chloride: 96 mmol/L — ABNORMAL LOW (ref 98–111)
Creatinine, Ser: 1.15 mg/dL (ref 0.61–1.24)
GFR, Estimated: >60 mL/min (ref >60)
Glucose, Bld: 106 mg/dL — ABNORMAL HIGH (ref 70–99)
Potassium: 3.6 mmol/L (ref 3.5–5.1)
Sodium: 133 mmol/L — ABNORMAL LOW (ref 135–145)
Total Bilirubin: 0.6 mg/dL (ref 0.3–1.2)
Total Protein: 6.9 g/dL (ref 6.5–8.1)

## 2019-12-22 LAB — CBC
HCT: 43 % (ref 39.0–52.0)
Hemoglobin: 14.3 g/dL (ref 13.0–17.0)
MCH: 31.5 pg (ref 26.0–34.0)
MCHC: 33.3 g/dL (ref 30.0–36.0)
MCV: 94.7 fL (ref 80.0–100.0)
Platelets: 304 10*3/uL (ref 150–400)
RBC: 4.54 MIL/uL (ref 4.22–5.81)
RDW: 12.1 % (ref 11.5–15.5)
WBC: 7.3 10*3/uL (ref 4.0–10.5)
nRBC: 0 % (ref 0.0–0.2)

## 2019-12-22 LAB — POCT URINALYSIS DIP (MANUAL ENTRY)
Bilirubin, UA: NEGATIVE
Glucose, UA: NEGATIVE mg/dL
Ketones, POC UA: NEGATIVE mg/dL
Leukocytes, UA: NEGATIVE
Nitrite, UA: NEGATIVE
Protein Ur, POC: NEGATIVE mg/dL
Spec Grav, UA: >=1.03 — AB (ref 1.010–1.025)
Urobilinogen, UA: 0.2 E.U./dL
pH, UA: 5.5 (ref 5.0–8.0)

## 2019-12-22 LAB — URINALYSIS, ROUTINE W REFLEX MICROSCOPIC
Bilirubin Urine: NEGATIVE
Glucose, UA: NEGATIVE mg/dL
Hgb urine dipstick: NEGATIVE
Ketones, ur: NEGATIVE mg/dL
Leukocytes,Ua: NEGATIVE
Nitrite: NEGATIVE
Protein, ur: NEGATIVE mg/dL
Specific Gravity, Urine: 1.019 (ref 1.005–1.030)
pH: 5 (ref 5.0–8.0)

## 2019-12-22 LAB — LIPASE, BLOOD: Lipase: 35 U/L (ref 11–51)

## 2019-12-22 MED ORDER — ALUM & MAG HYDROXIDE-SIMETH 200-200-20 MG/5ML PO SUSP
30.0000 mL | Freq: Once | ORAL | Status: AC
  Administered 2019-12-22: 30 mL via ORAL
  Filled 2019-12-22: qty 30

## 2019-12-22 MED ORDER — LIDOCAINE VISCOUS HCL 2 % MT SOLN
15.0000 mL | Freq: Once | OROMUCOSAL | Status: AC
  Administered 2019-12-22: 15 mL via ORAL
  Filled 2019-12-22: qty 15

## 2019-12-22 MED ORDER — OMEPRAZOLE 20 MG PO CPDR: 20.0000 mg | DELAYED_RELEASE_CAPSULE | Freq: Two times a day (BID) | ORAL | 0 refills | Status: DC

## 2019-12-22 NOTE — ED Provider Notes (Signed)
[facility] EMERGENCY DEPARTMENT Provider Note   CSN: [id number] Arrival date & time: 12/22/19  1328     History Chief Complaint  Patient presents with  . Abdominal Pain    Anthony Lin is a 49 y.o. male.  49 year old male with complaint of RUQ abdominal pain. Patient reports intermittent pain for some time now, worse yesterday after eating a handful of fruit loops cereal with 2% milk. Pain was significant but eventually resolved yesterday and returned as soon as he ate the same breakfast today. Patient states he recently returned to work after carpal tunnel surgery, takes occasional NSAIDs but denies frequent use. Reports pain only seems to occur on days he is at work, able to do work around his house without eliciting pain. Denies dark or bloody stools, changes in bowel or bladder habits, nausea, vomiting, fevers, chills. Patient takes Omeprazole 20 mg daily, denies missing any doses. Prior abdominal surgeries include appendectomy and cholecystectomy. States he has had occasional upper abdominal pains similar to prior gall bladder attack type pain from time to time post cholecystectomy. Patient went to his PCP a few weeks ago for this pain and had an [place] that diagnosed fatty liver disease, possibly a kidney stone. Patient went to urgent care today and was sent to the ER for a CT.         Past Medical History:  Diagnosis Date  . GERD (gastroesophageal reflux disease)   . Hypertension     Patient Active Problem List   Diagnosis Date Noted  . Allergic rhinitis 09/19/2015  . BMI 37.0-37.9, adult 09/19/2015  . Left-sided low back pain without sciatica 09/19/2015    Past Surgical History:  Procedure Laterality Date  . APPENDECTOMY    . CARPAL TUNNEL RELEASE Right 09/14/2019   Procedure: RIGHT CARPAL TUNNEL RELEASE;  Surgeon: Leanora Cover, MD;  Location: [place];  Service: Orthopedics;  Laterality: Right;  Bier block  . CARPAL TUNNEL RELEASE Left 10/05/2019    Procedure: CARPAL TUNNEL RELEASE;  Surgeon: Leanora Cover, MD;  Location: [place];  Service: Orthopedics;  Laterality: Left;  . CHOLECYSTECTOMY    . TONSILLECTOMY         No family history on file.  Social History   Tobacco Use  . Smoking status: Never Smoker  . Smokeless tobacco: Never Used  Vaping Use  . Vaping Use: Never used  Substance Use Topics  . Alcohol use: No  . Drug use: No    Home Medications Prior to Admission medications   Medication Sig Start Date End Date Taking? Authorizing Provider  HYDROcodone-acetaminophen (NORCO) 5-325 MG tablet 1-2 tabs po q6 hours prn pain 10/05/19   Leanora Cover, MD  lisinopril-hydrochlorothiazide (ZESTORETIC) 20-12.5 MG tablet Take 1 tablet by mouth daily.    [provider]  omeprazole (PRILOSEC) 20 MG capsule Take 1 capsule (20 mg total) by mouth 2 (two) times daily before a meal. 12/22/19 01/21/20  Tacy Learn, PA-C    Allergies    Zithromax [azithromycin]  Review of Systems   Review of Systems  Constitutional: Negative for chills and fever.  Respiratory: Negative for cough and shortness of breath.   Cardiovascular: Negative for chest pain.  Gastrointestinal: Positive for abdominal pain. Negative for blood in stool, constipation, diarrhea, nausea and vomiting.  Genitourinary: Negative for dysuria and frequency.  Musculoskeletal: Negative for back pain.  Skin: Negative for rash and wound.  Allergic/Immunologic: Negative for immunocompromised state.  Neurological: Negative for weakness.  All other systems reviewed and are negative.   Physical Exam Updated Vital Signs BP 97/61 (BP Location: Right Arm)   Pulse 80   Temp 98.2 F (36.8 C) (Oral)   Resp 16   Ht 5\' 11"  (1.803 m)   Wt 122.5 kg   SpO2 100%   BMI 37.66 kg/m   Physical Exam Vitals and nursing note reviewed.  Constitutional:      General: He is not in acute distress.    Appearance: He is well-developed. He is not diaphoretic.    HENT:     Head: Normocephalic and atraumatic.  Cardiovascular:     Rate and Rhythm: Normal rate and regular rhythm.     Heart sounds: Normal heart sounds.  Pulmonary:     Effort: Pulmonary effort is normal.     Breath sounds: Normal breath sounds.  Abdominal:     Palpations: Abdomen is soft.     Tenderness: There is abdominal tenderness in the right upper quadrant and epigastric area. There is no right CVA tenderness or left CVA tenderness.  Skin:    General: Skin is warm and dry.     Findings: No erythema or rash.  Neurological:     Mental Status: He is alert and oriented to person, place, and time.  Psychiatric:        Behavior: Behavior normal.     ED Results / Procedures / Treatments   Labs (all labs ordered are listed, but only abnormal results are displayed) Labs Reviewed  COMPREHENSIVE METABOLIC PANEL - Abnormal; Notable for the following components:      Result Value   Sodium 133 (*)    Chloride 96 (*)    Glucose, Bld 106 (*)    All other components within normal limits  LIPASE, BLOOD  CBC  URINALYSIS, ROUTINE W REFLEX MICROSCOPIC    EKG None  Radiology No results found.  Procedures Procedures (including critical care time)  Medications Ordered in ED Medications  alum & mag hydroxide-simeth (MAALOX/MYLANTA) 200-200-20 MG/5ML suspension 30 mL (30 mLs Oral Given 12/22/19 1940)    And  lidocaine (XYLOCAINE) 2 % viscous mouth solution 15 mL (15 mLs Oral Given 12/22/19 1940)    ED Course  I have reviewed the triage vital signs and the nursing notes.  Pertinent labs & imaging results that were available during my care of the patient were reviewed by me and considered in my medical decision making (see chart for details).  Clinical Course as of Dec 22 2014  Tue Dec 22, 2019  2014 49 yo male with complaint of upper abdominal pain as detailed above. On exam, pain has improved since onset, has mild epigastric and RUQ tenderness.  Labs reassuring including  CBC, CMP, lipase, UA.  BP low at UC earlier, all subsequent readings WNL.  Patient was given GI cocktail with resolution of pain. Known fatty liver disease, post cholecystectomy, labs normal. Discussed with patient, at this time patient does not need CT abdomen/pelvis, may benefit from GI evaluation and possible endoscopy. Advised to avoid NSAIDs, discussed dietary changes for fatty liver and increased omeprazole to BID dosing. Return precautions given.    [LM]    Clinical Course User Index [LM] Roque Lias   MDM Rules/Calculators/A&P                          Final Clinical Impression(s) / ED Diagnoses Final diagnoses:  Epigastric pain    Rx / DC  Orders ED Discharge Orders         Ordered    omeprazole (PRILOSEC) 20 MG capsule  2 times daily before meals        12/22/19 1957           Roque Lias 12/22/19 2017    Elnora Morrison, MD 12/26/19 2344

## 2019-12-22 NOTE — Discharge Instructions (Addendum)
Please go to ER for further evaluation.

## 2019-12-22 NOTE — ED Provider Notes (Signed)
[facility]   [id number] 12/22/19 Arrival Time: 26  CC: ABDOMINAL DISCOMFORT  SUBJECTIVE:  Anthony Lin is a 49 y.o. male with history of kidney stone presents presented to the urgent care with a complaint of right upper quadrant pain that has been ongoing for a few month.  Denies a precipitating event, trauma, close contacts with similar symptoms, recent travel or antibiotic use.  Localizes pain to right upper quadrant.  Describes as intermittent and aching character.  Reports symptom has been unbearable for a few days.  Has had a CT scan completed couple months ago and patient stated it showed that he had a kidney stone.  Has not tried any OTC medication.  Denies  aggravating factors.  Report similar symptoms in the past.  Denies chills, fever, nausea, vomiting, bloody stool, mucoid stool.     No LMP for male patient.  ROS: As per HPI.  All other pertinent ROS negative.     Past Medical History:  Diagnosis Date   GERD (gastroesophageal reflux disease)    Hypertension    Past Surgical History:  Procedure Laterality Date   APPENDECTOMY     CARPAL TUNNEL RELEASE Right 09/14/2019   Procedure: RIGHT CARPAL TUNNEL RELEASE;  Surgeon: Leanora Cover, MD;  Location: [place];  Service: Orthopedics;  Laterality: Right;  Bier block   CARPAL TUNNEL RELEASE Left 10/05/2019   Procedure: CARPAL TUNNEL RELEASE;  Surgeon: Leanora Cover, MD;  Location: [facility];  Service: Orthopedics;  Laterality: Left;   CHOLECYSTECTOMY     TONSILLECTOMY     Allergies  Allergen Reactions   Zithromax [Azithromycin] Palpitations   No current facility-administered medications on file prior to encounter.   Current Outpatient Medications on File Prior to Encounter  Medication Sig Dispense Refill   HYDROcodone-acetaminophen (NORCO) 5-325 MG tablet 1-2 tabs po q6 hours prn pain 20 tablet 0   lisinopril-hydrochlorothiazide (ZESTORETIC) 20-12.5 MG tablet Take 1  tablet by mouth daily.     omeprazole (PRILOSEC) 20 MG capsule Take 20 mg by mouth daily.     Social History   Socioeconomic History   Marital status: Married    Spouse name: Not on file   Number of children: Not on file   Years of education: Not on file   Highest education level: Not on file  Occupational History   Not on file  Tobacco Use   Smoking status: Never Smoker   Smokeless tobacco: Never Used  Vaping Use   Vaping Use: Never used  Substance and Sexual Activity   Alcohol use: No   Drug use: No   Sexual activity: Not on file  Other Topics Concern   Not on file  Social History Narrative   Not on file   Social Determinants of Health   Financial Resource Strain:    Difficulty of Paying Living Expenses: Not on file  Food Insecurity:    Worried About [place] in the Last Year: Not on file   Ran Out of Food in the Last Year: Not on file  Transportation Needs:    Lack of Transportation (Medical): Not on file   Lack of Transportation (Non-Medical): Not on file  Physical Activity:    Days of Exercise per Week: Not on file   Minutes of Exercise per Session: Not on file  Stress:    Feeling of Stress : Not on file  Social Connections:    Frequency of Communication with Friends and Family: Not on  file   Frequency of Social Gatherings with Friends and Family: Not on file   Attends Religious Services: Not on file   Active Member of Clubs or Organizations: Not on file   Attends Archivist Meetings: Not on file   Marital Status: Not on file  Intimate Partner Violence:    Fear of Current or Ex-Partner: Not on file   Emotionally Abused: Not on file   Physically Abused: Not on file   Sexually Abused: Not on file   No family history on file.   OBJECTIVE:  Vitals:   12/22/19 1140 12/22/19 1142 12/22/19 1252  BP:  (!) 88/53 108/70  Pulse:  (!) 107 95  Temp:  98.1 F (36.7 C)   TempSrc:  Oral   SpO2:  96%     Weight: 270 lb (122.5 kg)    Height: 5\' 11"  (1.803 m)      Physical Exam Vitals and nursing note reviewed.  Constitutional:      General: He is not in acute distress.    Appearance: Normal appearance. He is normal weight. He is not toxic-appearing or diaphoretic.  Cardiovascular:     Rate and Rhythm: Normal rate and regular rhythm.     Heart sounds: Normal heart sounds. No murmur heard.  No friction rub. No gallop.   Pulmonary:     Effort: Pulmonary effort is normal. No respiratory distress.     Breath sounds: Normal breath sounds. No stridor. No wheezing, rhonchi or rales.  Chest:     Chest wall: No tenderness.  Abdominal:     General: Bowel sounds are normal.     Tenderness: There is abdominal tenderness in the right upper quadrant. There is no guarding. Negative signs include McBurney's sign.     Hernia: No hernia is present.  Neurological:     Mental Status: He is alert.      LABS: Results for orders placed or performed during the hospital encounter of 12/22/19 (from the past 24 hour(s))  POCT urinalysis dipstick     Status: Abnormal   Collection Time: 12/22/19 12:53 PM  Result Value Ref Range   Color, UA yellow yellow   Clarity, UA clear clear   Glucose, UA negative negative mg/dL   Bilirubin, UA negative negative   Ketones, POC UA negative negative mg/dL   Spec Grav, UA >=1.030 (A) 1.010 - 1.025   Blood, UA small (A) negative   pH, UA 5.5 5.0 - 8.0   Protein Ur, POC negative negative mg/dL   Urobilinogen, UA 0.2 0.2 or 1.0 E.U./dL   Nitrite, UA Negative Negative   Leukocytes, UA Negative Negative    DIAGNOSTIC STUDIES: No results found.   ASSESSMENT & PLAN:  1. Abdominal pain, right upper quadrant     No orders of the defined types were placed in this encounter.  Patient is stable at discharge.  He was advised to go to the ER for further evaluation to rule out other abdominal disease process with a CT scan.  Discharge instructions  Go to ER for  further evaluation  Reviewed expectations re: course of current medical issues. Questions answered. Outlined signs and symptoms indicating need for more acute intervention. Patient verbalized understanding. After Visit Summary given.   Emerson Monte, FNP 12/22/19 1307

## 2019-12-22 NOTE — Discharge Instructions (Signed)
Increase your omeprazole to twice daily dosing (morning and night). Try something else for breakfast, see if symptoms resolve with change in diet. See dietary guidelines for fatty liver.   Follow up with GI, call to schedule an appointment. Avoid NSAIDs, this includes ibuprofen, advil, motrin, naproxen, aleve.

## 2019-12-22 NOTE — ED Triage Notes (Signed)
Pt c/o abdominal pain for the last few months.Denies F/N/V/D.

## 2019-12-22 NOTE — ED Triage Notes (Signed)
Pt states that he is having some abdominal pain. Pt states that this has been going on for months but is starting to be unbearable.

## 2019-12-24 LAB — URINE CULTURE: Culture: <10000 — AB

## 2020-03-21 ENCOUNTER — Encounter (INDEPENDENT_AMBULATORY_CARE_PROVIDER_SITE_OTHER): Payer: Self-pay | Admitting: Gastroenterology

## 2020-03-21 ENCOUNTER — Telehealth (INDEPENDENT_AMBULATORY_CARE_PROVIDER_SITE_OTHER): Payer: Self-pay

## 2020-03-21 ENCOUNTER — Ambulatory Visit (INDEPENDENT_AMBULATORY_CARE_PROVIDER_SITE_OTHER): Payer: BC Managed Care – PPO | Admitting: Gastroenterology

## 2020-03-21 ENCOUNTER — Other Ambulatory Visit (INDEPENDENT_AMBULATORY_CARE_PROVIDER_SITE_OTHER): Payer: Self-pay

## 2020-03-21 ENCOUNTER — Other Ambulatory Visit: Payer: Self-pay

## 2020-03-21 ENCOUNTER — Encounter (INDEPENDENT_AMBULATORY_CARE_PROVIDER_SITE_OTHER): Payer: Self-pay

## 2020-03-21 DIAGNOSIS — Z1211 Encounter for screening for malignant neoplasm of colon: Secondary | ICD-10-CM

## 2020-03-21 DIAGNOSIS — K760 Fatty (change of) liver, not elsewhere classified: Secondary | ICD-10-CM

## 2020-03-21 DIAGNOSIS — R1013 Epigastric pain: Secondary | ICD-10-CM

## 2020-03-21 DIAGNOSIS — G8929 Other chronic pain: Secondary | ICD-10-CM | POA: Diagnosis not present

## 2020-03-21 MED ORDER — NAPROXEN 500 MG PO TABS: 500.0000 mg | ORAL_TABLET | Freq: Two times a day (BID) | ORAL | 0 refills | Status: AC

## 2020-03-21 MED ORDER — PEG 3350-KCL-NA BICARB-NACL 420 G PO SOLR: 4000.0000 mL | ORAL | 0 refills | Status: DC

## 2020-03-21 NOTE — Telephone Encounter (Signed)
Anthony Lin, CMA  

## 2020-03-21 NOTE — Progress Notes (Signed)
Anthony Lin, M.D. Gastroenterology & Hepatology [facility] [address] [place], [place] [address] Primary Care Physician: Monico Blitz, [place] [address]  Referring MD: PCP  Chief Complaint: Abdominal pain  History of Present Illness: Anthony Lin is a 50 y.o. male with PMh GERD and HTN, who presents for evaluation of abdominal pain.   Patient reports that since October 2021 he has presented some episodes of abdominal pain. He states that usually around 10 AM - 1 PM he presents epigastric abdominal pain that radiates to his back as a cramping sensation. States that at the end of the day the pain fades away.Symptoms are present on a daily basis. No nighttime symptoms. He takes ibuprofen or Aleve occasionally but does not feel any improvement with them. He states that the pain is severe , almost to a 9/10 but when he is not moving his body he goes down to 1/10.  However, he states the when he strains more he presents worsening symptoms. He reports the pain is 1/10 when he is relaxing at home.  States that when he has a bowel movement his pain gets slightly better, has 1 BM. Also when he applies pressure the pain resolves.  Due to persistent symptoms , patient was seen in the ER on 12/22/2019 at [facility]  At that time he had CMP performed that showed mild elevation of his ALT to 37 but no other major alterations, with normal CBC.  As he had a recent abdominal imaging this was not repeated.  However due to persistent symptoms on 12/28/2019 he went to [facility], a CT of the abdomen and pelvis with IV contrast was performed that day which showed presence of fatty liver but without any other alterations.   Takes Prilosec in AM with adequate control of heartburn but he denies any improvement of his pain with the medication.  The patient denies having any nausea, vomiting, fever, chills, hematochezia, melena,  hematemesis, abdominal distention, abdominal pain, diarrhea, jaundice, pruritus or weight loss.   Last ZSW:[id number] Last Colonoscopy:never  FHx: neg for any gastrointestinal/liver disease, lung cancer Social: neg smoking, alcohol or illicit drug use Surgical: cholecystectomy, appendectomy  Past Medical History: Past Medical History:  Diagnosis Date  . GERD (gastroesophageal reflux disease)   . Hypertension     Past Surgical History: Past Surgical History:  Procedure Laterality Date  . APPENDECTOMY    . CARPAL TUNNEL RELEASE Right 09/14/2019   Procedure: RIGHT CARPAL TUNNEL RELEASE;  Surgeon: Leanora Cover, MD;  Location: [place];  Service: Orthopedics;  Laterality: Right;  Bier block  . CARPAL TUNNEL RELEASE Left 10/05/2019   Procedure: CARPAL TUNNEL RELEASE;  Surgeon: Leanora Cover, MD;  Location: [place];  Service: Orthopedics;  Laterality: Left;  . CHOLECYSTECTOMY    . TONSILLECTOMY      Family History: Family History  Problem Relation Age of Onset  . Heart disease Mother   . Dementia Mother   . Lung cancer Father   . Arthritis Father   . Healthy Sister   . Healthy Sister   . Cervical cancer Sister   . Heart disease Brother   . Healthy Brother   . Seizures Brother     Social History: Social History   Tobacco Use  Smoking Status Never Smoker  Smokeless Tobacco Current User  . Types: Chew   Social History   Substance and Sexual Activity  Alcohol Use No  Social History   Substance and Sexual Activity  Drug Use No    Allergies: Allergies  Allergen Reactions  . Zithromax [Azithromycin] Palpitations    Medications: Current Outpatient Medications  Medication Sig Dispense Refill  . aspirin-acetaminophen-caffeine (EXCEDRIN MIGRAINE) 250-250-65 MG tablet Take by mouth every 6 (six) hours as needed for headache.    . lisinopril-hydrochlorothiazide (ZESTORETIC) 20-12.5 MG tablet Take 1 tablet by mouth daily.    . naproxen  sodium (ALEVE) 220 MG tablet Take 220 mg by mouth daily as needed.    Marland Kitchen omeprazole (PRILOSEC) 20 MG capsule Take 1 capsule (20 mg total) by mouth 2 (two) times daily before a meal. 60 capsule 0  . HYDROcodone-acetaminophen (NORCO) 5-325 MG tablet 1-2 tabs po q6 hours prn pain (Patient not taking: Reported on 03/21/2020) 20 tablet 0   No current facility-administered medications for this visit.    Review of Systems: GENERAL: negative for malaise, night sweats HEENT: No changes in hearing or vision, no nose bleeds or other nasal problems. NECK: Negative for lumps, goiter, pain and significant neck swelling RESPIRATORY: Negative for cough, wheezing CARDIOVASCULAR: Negative for chest pain, leg swelling, palpitations, orthopnea GI: SEE HPI MUSCULOSKELETAL: Negative for joint pain or swelling, back pain, and muscle pain. SKIN: Negative for lesions, rash PSYCH: Negative for sleep disturbance, mood disorder and recent psychosocial stressors. HEMATOLOGY Negative for prolonged bleeding, bruising easily, and swollen nodes. ENDOCRINE: Negative for cold or heat intolerance, polyuria, polydipsia and goiter. NEURO: negative for tremor, gait imbalance, syncope and seizures. The remainder of the review of systems is noncontributory.   Physical Exam: BP 103/69 (BP Location: Left Arm, Patient Position: Sitting, Cuff Size: Large)   Pulse (!) 106   Temp 98.5 F (36.9 C) (Oral)   Ht 5' 11.25" (1.81 m)   Wt 280 lb (127 kg)   BMI 38.78 kg/m  GENERAL: The patient is AO x3, in no acute distress. Obese. HEENT: Head is normocephalic and atraumatic. EOMI are intact. Mouth is well hydrated and without lesions. NECK: Supple. No masses LUNGS: Clear to auscultation. No presence of rhonchi/wheezing/rales. Adequate chest expansion HEART: RRR, normal s1 and s2. ABDOMEN: tender to palpation in the xyphoid area and in the R costal ridge, no guarding, no peritoneal signs, and nondistended. BS +. No  masses. EXTREMITIES: Without any cyanosis, clubbing, rash, lesions or edema. NEUROLOGIC: AOx3, no focal motor deficit. SKIN: no jaundice, no rashes   Imaging/Labs: as above  I personally reviewed and interpreted the available labs, imaging and endoscopic files.  Impression and Plan: WELDON NOURI is a 50 y.o. male with PMh GERD and HTN, who presents for evaluation of abdominal pain.  The patient has presented persistent symptoms of abdominal pain that are usually worsened with straining and physical movements.  He has not presented any other symptom otherwise.  He had previous abdominal imaging performed that was negative for any alteration that could explain his current presentation.  Clinically, his symptoms seem to be related to a musculoskeletal disease such as costochondritis or abdominal wall pain.  Nevertheless, we will proceed to perform an EGD to evaluate this further.  I will prescribe 3-week naproxen trial to treat his symptoms but if he is refractory to this and has no endoscopic alterations will need to pursue with referral for possible lidocaine injection in this area.  I also advised him he can use lidocaine patches to relieve the pain.  On the other hand, he is due for colorectal cancer screening, for which I will order  a colonoscopy.  Finally, he has mild elevation of his ALT with presence of fatty liver on imaging which is consistent with these alterations, for which she would benefit from weight loss.  - Start naproxen 500 mg q12 hours for 3 weeks -Schedule EGD and colonoscopy -Due to the symptoms,  -Can use lidocaine patches (buy over the counter) at the site of pain if not controlled with naproxen  All questions were answered.      Anthony Peppers, MD Gastroenterology and Hepatology [facility]

## 2020-03-21 NOTE — Patient Instructions (Addendum)
Start naproxen 500 mg q12 hours for 3 weeks Schedule EGD and colonoscopy Can use lidocaine patches (buy over the counter) at the site of pain if not controlled with naproxen

## 2020-03-21 NOTE — H&P (View-Only) (Signed)
Maylon Peppers, M.D. Gastroenterology & Hepatology [facility] [address] [place], [place] [address] Primary Care Physician: Monico Blitz, [place] [address]  Referring MD: PCP  Chief Complaint: Abdominal pain  History of Present Illness: Anthony Lin is a 50 y.o. male with PMh GERD and HTN, who presents for evaluation of abdominal pain.   Patient reports that since October 2021 he has presented some episodes of abdominal pain. He states that usually around 10 AM - 1 PM he presents epigastric abdominal pain that radiates to his back as a cramping sensation. States that at the end of the day the pain fades away.Symptoms are present on a daily basis. No nighttime symptoms. He takes ibuprofen or Aleve occasionally but does not feel any improvement with them. He states that the pain is severe , almost to a 9/10 but when he is not moving his body he goes down to 1/10.  However, he states the when he strains more he presents worsening symptoms. He reports the pain is 1/10 when he is relaxing at home.  States that when he has a bowel movement his pain gets slightly better, has 1 BM. Also when he applies pressure the pain resolves.  Due to persistent symptoms , patient was seen in the ER on 12/22/2019 at [facility].  At that time he had CMP performed that showed mild elevation of his ALT to 37 but no other major alterations, with normal CBC.  As he had a recent abdominal imaging this was not repeated.  However due to persistent symptoms on 12/28/2019 he went to [facility], a CT of the abdomen and pelvis with IV contrast was performed that day which showed presence of fatty liver but without any other alterations.   Takes Prilosec in AM with adequate control of heartburn but he denies any improvement of his pain with the medication.  The patient denies having any nausea, vomiting, fever, chills, hematochezia, melena,  hematemesis, abdominal distention, abdominal pain, diarrhea, jaundice, pruritus or weight loss.   Last XVQ:MGQQP Last Colonoscopy:never  FHx: neg for any gastrointestinal/liver disease, lung cancer Social: neg smoking, alcohol or illicit drug use Surgical: cholecystectomy, appendectomy  Past Medical History: Past Medical History:  Diagnosis Date  . GERD (gastroesophageal reflux disease)   . Hypertension     Past Surgical History: Past Surgical History:  Procedure Laterality Date  . APPENDECTOMY    . CARPAL TUNNEL RELEASE Right 09/14/2019   Procedure: RIGHT CARPAL TUNNEL RELEASE;  Surgeon: Leanora Cover, MD;  Location: [place];  Service: Orthopedics;  Laterality: Right;  Bier block  . CARPAL TUNNEL RELEASE Left 10/05/2019   Procedure: CARPAL TUNNEL RELEASE;  Surgeon: Leanora Cover, MD;  Location: [place];  Service: Orthopedics;  Laterality: Left;  . CHOLECYSTECTOMY    . TONSILLECTOMY      Family History: Family History  Problem Relation Age of Onset  . Heart disease Mother   . Dementia Mother   . Lung cancer Father   . Arthritis Father   . Healthy Sister   . Healthy Sister   . Cervical cancer Sister   . Heart disease Brother   . Healthy Brother   . Seizures Brother     Social History: Social History   Tobacco Use  Smoking Status Never Smoker  Smokeless Tobacco Current User  . Types: Chew   Social History   Substance and Sexual Activity  Alcohol Use No  Social History   Substance and Sexual Activity  Drug Use No    Allergies: Allergies  Allergen Reactions  . Zithromax [Azithromycin] Palpitations    Medications: Current Outpatient Medications  Medication Sig Dispense Refill  . aspirin-acetaminophen-caffeine (EXCEDRIN MIGRAINE) 250-250-65 MG tablet Take by mouth every 6 (six) hours as needed for headache.    . lisinopril-hydrochlorothiazide (ZESTORETIC) 20-12.5 MG tablet Take 1 tablet by mouth daily.    . naproxen  sodium (ALEVE) 220 MG tablet Take 220 mg by mouth daily as needed.    Marland Kitchen omeprazole (PRILOSEC) 20 MG capsule Take 1 capsule (20 mg total) by mouth 2 (two) times daily before a meal. 60 capsule 0  . HYDROcodone-acetaminophen (NORCO) 5-325 MG tablet 1-2 tabs po q6 hours prn pain (Patient not taking: Reported on 03/21/2020) 20 tablet 0   No current facility-administered medications for this visit.    Review of Systems: GENERAL: negative for malaise, night sweats HEENT: No changes in hearing or vision, no nose bleeds or other nasal problems. NECK: Negative for lumps, goiter, pain and significant neck swelling RESPIRATORY: Negative for cough, wheezing CARDIOVASCULAR: Negative for chest pain, leg swelling, palpitations, orthopnea GI: SEE HPI MUSCULOSKELETAL: Negative for joint pain or swelling, back pain, and muscle pain. SKIN: Negative for lesions, rash PSYCH: Negative for sleep disturbance, mood disorder and recent psychosocial stressors. HEMATOLOGY Negative for prolonged bleeding, bruising easily, and swollen nodes. ENDOCRINE: Negative for cold or heat intolerance, polyuria, polydipsia and goiter. NEURO: negative for tremor, gait imbalance, syncope and seizures. The remainder of the review of systems is noncontributory.   Physical Exam: BP 103/69 (BP Location: Left Arm, Patient Position: Sitting, Cuff Size: Large)   Pulse (!) 106   Temp 98.5 F (36.9 C) (Oral)   Ht 5' 11.25" (1.81 m)   Wt 280 lb (127 kg)   BMI 38.78 kg/m  GENERAL: The patient is AO x3, in no acute distress. Obese. HEENT: Head is normocephalic and atraumatic. EOMI are intact. Mouth is well hydrated and without lesions. NECK: Supple. No masses LUNGS: Clear to auscultation. No presence of rhonchi/wheezing/rales. Adequate chest expansion HEART: RRR, normal s1 and s2. ABDOMEN: tender to palpation in the xyphoid area and in the R costal ridge, no guarding, no peritoneal signs, and nondistended. BS +. No  masses. EXTREMITIES: Without any cyanosis, clubbing, rash, lesions or edema. NEUROLOGIC: AOx3, no focal motor deficit. SKIN: no jaundice, no rashes   Imaging/Labs: as above  I personally reviewed and interpreted the available labs, imaging and endoscopic files.  Impression and Plan: Anthony Lin is a 50 y.o. male with PMh GERD and HTN, who presents for evaluation of abdominal pain.  The patient has presented persistent symptoms of abdominal pain that are usually worsened with straining and physical movements.  He has not presented any other symptom otherwise.  He had previous abdominal imaging performed that was negative for any alteration that could explain his current presentation.  Clinically, his symptoms seem to be related to a musculoskeletal disease such as costochondritis or abdominal wall pain.  Nevertheless, we will proceed to perform an EGD to evaluate this further.  I will prescribe 3-week naproxen trial to treat his symptoms but if he is refractory to this and has no endoscopic alterations will need to pursue with referral for possible lidocaine injection in this area.  I also advised him he can use lidocaine patches to relieve the pain.  On the other hand, he is due for colorectal cancer screening, for which I will order  a colonoscopy.  Finally, he has mild elevation of his ALT with presence of fatty liver on imaging which is consistent with these alterations, for which she would benefit from weight loss.  - Start naproxen 500 mg q12 hours for 3 weeks -Schedule EGD and colonoscopy -Due to the symptoms,  -Can use lidocaine patches (buy over the counter) at the site of pain if not controlled with naproxen  All questions were answered.      Maylon Peppers, MD Gastroenterology and Hepatology [facility]

## 2020-04-12 ENCOUNTER — Other Ambulatory Visit (HOSPITAL_COMMUNITY)
Admission: RE | Admit: 2020-04-12 | Discharge: 2020-04-12 | Disposition: A | Discharge status: Home / Self Care | Payer: BC Managed Care – PPO | Source: Ambulatory Visit | Attending: Gastroenterology | Admitting: Gastroenterology

## 2020-04-12 ENCOUNTER — Other Ambulatory Visit: Payer: Self-pay

## 2020-04-12 DIAGNOSIS — Z20822 Contact with and (suspected) exposure to covid-19: Secondary | ICD-10-CM | POA: Insufficient documentation

## 2020-04-12 DIAGNOSIS — Z01812 Encounter for preprocedural laboratory examination: Secondary | ICD-10-CM | POA: Insufficient documentation

## 2020-04-12 DIAGNOSIS — Z79899 Other long term (current) drug therapy: Secondary | ICD-10-CM | POA: Diagnosis not present

## 2020-04-12 DIAGNOSIS — Z8049 Family history of malignant neoplasm of other genital organs: Secondary | ICD-10-CM | POA: Diagnosis not present

## 2020-04-12 DIAGNOSIS — Z8249 Family history of ischemic heart disease and other diseases of the circulatory system: Secondary | ICD-10-CM | POA: Diagnosis not present

## 2020-04-12 DIAGNOSIS — Z820 Family history of epilepsy and other diseases of the nervous system: Secondary | ICD-10-CM | POA: Diagnosis not present

## 2020-04-12 DIAGNOSIS — Z1211 Encounter for screening for malignant neoplasm of colon: Secondary | ICD-10-CM | POA: Diagnosis present

## 2020-04-12 DIAGNOSIS — Z8261 Family history of arthritis: Secondary | ICD-10-CM | POA: Diagnosis not present

## 2020-04-12 DIAGNOSIS — K2950 Unspecified chronic gastritis without bleeding: Secondary | ICD-10-CM | POA: Diagnosis not present

## 2020-04-12 DIAGNOSIS — D122 Benign neoplasm of ascending colon: Secondary | ICD-10-CM | POA: Diagnosis not present

## 2020-04-12 DIAGNOSIS — Z7982 Long term (current) use of aspirin: Secondary | ICD-10-CM | POA: Diagnosis not present

## 2020-04-12 DIAGNOSIS — Z881 Allergy status to other antibiotic agents status: Secondary | ICD-10-CM | POA: Diagnosis not present

## 2020-04-12 DIAGNOSIS — Z801 Family history of malignant neoplasm of trachea, bronchus and lung: Secondary | ICD-10-CM | POA: Diagnosis not present

## 2020-04-12 DIAGNOSIS — F17220 Nicotine dependence, chewing tobacco, uncomplicated: Secondary | ICD-10-CM | POA: Diagnosis not present

## 2020-04-12 DIAGNOSIS — K317 Polyp of stomach and duodenum: Secondary | ICD-10-CM | POA: Diagnosis not present

## 2020-04-12 DIAGNOSIS — K219 Gastro-esophageal reflux disease without esophagitis: Secondary | ICD-10-CM | POA: Diagnosis not present

## 2020-04-12 LAB — BASIC METABOLIC PANEL
Anion gap: 6 (ref 5–15)
BUN: 12 mg/dL (ref 6–20)
CO2: 27 mmol/L (ref 22–32)
Calcium: 8.6 mg/dL — ABNORMAL LOW (ref 8.9–10.3)
Chloride: 103 mmol/L (ref 98–111)
Creatinine, Ser: 0.85 mg/dL (ref 0.61–1.24)
GFR, Estimated: >60 mL/min (ref >60)
Glucose, Bld: 108 mg/dL — ABNORMAL HIGH (ref 70–99)
Potassium: 3.9 mmol/L (ref 3.5–5.1)
Sodium: 136 mmol/L (ref 135–145)

## 2020-04-12 LAB — SARS CORONAVIRUS 2 (TAT 6-24 HRS): SARS Coronavirus 2: NEGATIVE

## 2020-04-13 ENCOUNTER — Ambulatory Visit (HOSPITAL_COMMUNITY)
Admission: RE | Admit: 2020-04-13 | Discharge: 2020-04-13 | Disposition: A | Discharge status: Home / Self Care | Payer: BC Managed Care – PPO | Attending: Gastroenterology | Admitting: Gastroenterology

## 2020-04-13 ENCOUNTER — Encounter (HOSPITAL_COMMUNITY)
Admission: RE | Disposition: A | Discharge status: Home / Self Care | Payer: Self-pay | Source: Home / Self Care | Attending: Gastroenterology

## 2020-04-13 ENCOUNTER — Ambulatory Visit (HOSPITAL_COMMUNITY): Payer: BC Managed Care – PPO | Admitting: Anesthesiology

## 2020-04-13 ENCOUNTER — Encounter (HOSPITAL_COMMUNITY): Payer: Self-pay | Admitting: Gastroenterology

## 2020-04-13 ENCOUNTER — Other Ambulatory Visit: Payer: Self-pay

## 2020-04-13 DIAGNOSIS — K3189 Other diseases of stomach and duodenum: Secondary | ICD-10-CM

## 2020-04-13 DIAGNOSIS — Z820 Family history of epilepsy and other diseases of the nervous system: Secondary | ICD-10-CM | POA: Insufficient documentation

## 2020-04-13 DIAGNOSIS — D122 Benign neoplasm of ascending colon: Secondary | ICD-10-CM

## 2020-04-13 DIAGNOSIS — Z8249 Family history of ischemic heart disease and other diseases of the circulatory system: Secondary | ICD-10-CM | POA: Insufficient documentation

## 2020-04-13 DIAGNOSIS — F17220 Nicotine dependence, chewing tobacco, uncomplicated: Secondary | ICD-10-CM | POA: Insufficient documentation

## 2020-04-13 DIAGNOSIS — Z7982 Long term (current) use of aspirin: Secondary | ICD-10-CM | POA: Insufficient documentation

## 2020-04-13 DIAGNOSIS — Z79899 Other long term (current) drug therapy: Secondary | ICD-10-CM | POA: Insufficient documentation

## 2020-04-13 DIAGNOSIS — Z20822 Contact with and (suspected) exposure to covid-19: Secondary | ICD-10-CM | POA: Insufficient documentation

## 2020-04-13 DIAGNOSIS — Z881 Allergy status to other antibiotic agents status: Secondary | ICD-10-CM | POA: Insufficient documentation

## 2020-04-13 DIAGNOSIS — K2950 Unspecified chronic gastritis without bleeding: Secondary | ICD-10-CM | POA: Insufficient documentation

## 2020-04-13 DIAGNOSIS — K219 Gastro-esophageal reflux disease without esophagitis: Secondary | ICD-10-CM | POA: Insufficient documentation

## 2020-04-13 DIAGNOSIS — Z801 Family history of malignant neoplasm of trachea, bronchus and lung: Secondary | ICD-10-CM | POA: Insufficient documentation

## 2020-04-13 DIAGNOSIS — Z1211 Encounter for screening for malignant neoplasm of colon: Secondary | ICD-10-CM

## 2020-04-13 DIAGNOSIS — Z8261 Family history of arthritis: Secondary | ICD-10-CM | POA: Insufficient documentation

## 2020-04-13 DIAGNOSIS — Z8049 Family history of malignant neoplasm of other genital organs: Secondary | ICD-10-CM | POA: Insufficient documentation

## 2020-04-13 DIAGNOSIS — K317 Polyp of stomach and duodenum: Secondary | ICD-10-CM | POA: Insufficient documentation

## 2020-04-13 DIAGNOSIS — R1011 Right upper quadrant pain: Secondary | ICD-10-CM | POA: Diagnosis not present

## 2020-04-13 HISTORY — PX: POLYPECTOMY: SHX149

## 2020-04-13 HISTORY — PX: COLONOSCOPY WITH PROPOFOL: SHX5780

## 2020-04-13 HISTORY — PX: POLYPECTOMY: SHX5525

## 2020-04-13 HISTORY — PX: ESOPHAGOGASTRODUODENOSCOPY (EGD) WITH PROPOFOL: SHX5813

## 2020-04-13 HISTORY — PX: BIOPSY: SHX5522

## 2020-04-13 LAB — HM COLONOSCOPY

## 2020-04-13 SURGERY — ESOPHAGOGASTRODUODENOSCOPY (EGD) WITH PROPOFOL
Anesthesia: General

## 2020-04-13 MED ORDER — LACTATED RINGERS IV SOLN: INTRAVENOUS | Status: DC

## 2020-04-13 MED ORDER — PROPOFOL 500 MG/50ML IV EMUL
INTRAVENOUS | Status: DC | PRN
  Administered 2020-04-13: 150 ug/kg/min via INTRAVENOUS

## 2020-04-13 MED ORDER — LIDOCAINE VISCOUS HCL 2 % MT SOLN
15.0000 mL | Freq: Once | OROMUCOSAL | Status: AC
  Administered 2020-04-13: 15 mL via OROMUCOSAL

## 2020-04-13 MED ORDER — PROPOFOL 10 MG/ML IV BOLUS
INTRAVENOUS | Status: DC | PRN
  Administered 2020-04-13: 10 mg via INTRAVENOUS
  Administered 2020-04-13: 50 mg via INTRAVENOUS
  Administered 2020-04-13 (×2): 20 mg via INTRAVENOUS

## 2020-04-13 MED ORDER — LIDOCAINE VISCOUS HCL 2 % MT SOLN
OROMUCOSAL | Status: AC
  Filled 2020-04-13: qty 15

## 2020-04-13 NOTE — Anesthesia Postprocedure Evaluation (Signed)
Anesthesia Post Note  Patient: Anthony Lin  Procedure(s) Performed: ESOPHAGOGASTRODUODENOSCOPY (EGD) WITH PROPOFOL (N/A ) COLONOSCOPY WITH PROPOFOL (N/A ) BIOPSY POLYPECTOMY POLYPECTOMY INTESTINAL  Patient location during evaluation: Endoscopy Anesthesia Type: General Level of consciousness: awake and alert and oriented Pain management: pain level controlled Vital Signs Assessment: post-procedure vital signs reviewed and stable Respiratory status: spontaneous breathing Cardiovascular status: blood pressure returned to baseline Postop Assessment: no apparent nausea or vomiting Anesthetic complications: no   No complications documented.   Last Vitals:  Vitals:   04/13/20 0657  BP: 111/74  Pulse: 83  Resp: (!) 21  Temp: 36.8 C  SpO2: 96%    Last Pain:  Vitals:   04/13/20 0727  TempSrc:   PainSc: 0-No pain                 Abdullah Rizzi

## 2020-04-13 NOTE — Interval H&P Note (Signed)
History and Physical Interval Note:  04/13/2020 7:18 AM  Anthony Lin is a 50 y.o. male with PMh GERD and HTN, who presents for evaluation of abdominal pain and colorectal cancer screening.   Patient states that he is still having episodes of abdominal pain when he strains, usually when he bends over.  The pain is okay in the right upper quadrant.  He reports that when he is not working or at the end of the week he is feeling improvement of his pain.  He tried a 3-week trial of naproxen without any relief of his symptoms.  Has not tried any lidocaine patches or any other topical medication.  Denies any nausea, vomiting, fever, chills, night distention, melena or hematochezia.  The patient has never had a colonoscopy in the past.  The patient denies having any complaints such as change in her bowel movement consistency or frequency, no changes in her weight recently.  No family history of colorectal cancer.  BP 111/74   Pulse 83   Temp 98.2 F (36.8 C) (Oral)   Resp (!) 21   Ht 5\' 11"  (1.803 m)   Wt 122.5 kg   SpO2 96%   BMI 37.66 kg/m  GENERAL: The patient is AO x3, in no acute distress. HEENT: Head is normocephalic and atraumatic. EOMI are intact. Mouth is well hydrated and without lesions. NECK: Supple. No masses LUNGS: Clear to auscultation. No presence of rhonchi/wheezing/rales. Adequate chest expansion HEART: RRR, normal s1 and s2. ABDOMEN: Soft, nontender, no guarding, no peritoneal signs, and nondistended. BS +. No masses. EXTREMITIES: Without any cyanosis, clubbing, rash, lesions or edema. NEUROLOGIC: AOx3, no focal motor deficit. SKIN: no jaundice, no rashes   [name]  has presented today for surgery, with the diagnosis of Epigastric Pain screening colonoscopy.  The various methods of treatment have been discussed with the patient and family. After consideration of risks, benefits and other options for treatment, the patient has consented to  Procedure(s) with  comments: ESOPHAGOGASTRODUODENOSCOPY (EGD) WITH PROPOFOL (N/A) - AM COLONOSCOPY WITH PROPOFOL (N/A) as a surgical intervention.  The patient's history has been reviewed, patient examined, no change in status, stable for surgery.  I have reviewed the patient's chart and labs.  Questions were answered to the patient's satisfaction.     Anthony Lin

## 2020-04-13 NOTE — Discharge Instructions (Signed)
Stomach Polyps A stomach polyp, also called a gastric polyp, is a growth on the lining of the stomach. A stomach polyp may be found by chance when you are being examined for another reason. Most polyps are not dangerous, but some can be harmful because of their size, location, or type. Polyps that can become harmful include:  Large polyps. These can turn into open sores called ulcers. Ulcers can lead to stomach bleeding.  Gastric outlet obstructions. These polyps block food from moving from the stomach to the small intestine.  Adenomas. These polyps can become cancerous. What are the causes? Stomach polyps form when the lining of the stomach gets inflamed or damaged. Stomach inflammation and damage may be caused by:  A long-lasting stomach condition, such as gastritis.  Taking certain medicines to reduce stomach acid over a long period of time.  An inherited condition called familial adenomatous polyposis.  An infection caused by a type of bacteria called Helicobacter pylori, or H. pylori. What are the signs or symptoms? Usually, this condition does not cause any symptoms. If you do have symptoms, they may include:  Pain or tenderness in the abdomen.  Nausea.  Blood in the stool.  Anemia.  Trouble eating or swallowing. How is this diagnosed? This condition may be diagnosed with:  Your medical history.  A medical procedure called an upper endoscopy. This procedure uses a flexible tube with a tiny camera on the end to look inside your stomach.  A lab test in which a part of the polyp is examined. This test is done by removing a polyp tissue sample during an endoscopy to look at it under a microscope (biopsy). How is this treated? Treatment depends on the type, location, and size of the polyps. Treatment may include:  Checking the polyps regularly with an endoscopy.  Removing the polyps with an endoscopy procedure. This may be done if the polyps are harmful or can become  harmful. Removing a polyp often prevents problems from developing.  Removing the polyps with a surgery called a partial gastrectomy. This may be done in rare cases to remove very large polyps.  Treating the underlying condition that caused the polyps. Follow these instructions at home:  Take over-the-counter and prescription medicines only as told by your health care provider.  Avoid foods and drinks that make your symptoms worse.  Make sure that all the food you eat is properly cooked and stored. This will help prevent bacterial infection, such as H. pylori infection.  Do not drink alcohol if your health care provider tells you not to drink.  Do not use any products that contain nicotine or tobacco, such as cigarettes, e-cigarettes, and chewing tobacco. If you need help quitting, ask your health care provider.  Keep all follow-up visits as told by your health care provider. This is important.   Contact a health care provider if:  You develop new symptoms such as: ? Loss of appetite. ? Feeling full after eating a small meal. ? Vomiting. ? Losing weight without trying. ? Fatigue.  Your symptoms get worse.  You have questions about the tests or procedures you may need. Get help right away if:  You vomit blood.  You have severe abdominal pain.  You cannot eat or drink.  You have blood in your stool. Summary  A stomach polyp, also called a gastric polyp, is a growth on the lining of the stomach. Most polyps are not dangerous, but some can be harmful because of their size,  location, or type.  Usually, this condition does not cause any symptoms. If you do have symptoms, you may have abdominal pain, nausea, trouble eating or swallowing, or blood in your stool.  This condition may be diagnosed with a medical procedure called an upper endoscopy. A lab test in which a part of the polyp is examined may also be done. This test is done by removing a polyp tissue sample during an  endoscopy to look at it under a microscope (biopsy).  Keep all follow-up visits as told by your health care provider. This is important. This information is not intended to replace advice given to you by your health care provider. Make sure you discuss any questions you have with your health care provider. Document Revised: 02/27/2019 Document Reviewed: 02/27/2019 Elsevier Patient Education  2021 [place]. Colonoscopy, Adult, Care After This sheet gives you information about how to care for yourself after your procedure. Your health care provider may also give you more specific instructions. If you have problems or questions, contact your health care provider. What can I expect after the procedure? After the procedure, it is common to have:  A small amount of blood in your stool for 24 hours after the procedure.  Some gas.  Mild cramping or bloating of your abdomen. Follow these instructions at home: Eating and drinking  Drink enough fluid to keep your urine pale yellow.  Follow instructions from your health care provider about eating or drinking restrictions.  Resume your normal diet as instructed by your health care provider. Avoid heavy or fried foods that are hard to digest.   Activity  Rest as told by your health care provider.  Avoid sitting for a long time without moving. Get up to take short walks every 1-2 hours. This is important to improve blood flow and breathing. Ask for help if you feel weak or unsteady.  Return to your normal activities as told by your health care provider. Ask your health care provider what activities are safe for you. Managing cramping and bloating  Try walking around when you have cramps or feel bloated.  Apply heat to your abdomen as told by your health care provider. Use the heat source that your health care provider recommends, such as a moist heat pack or a heating pad. ? Place a towel between your skin and the heat source. ? Leave the  heat on for 20-30 minutes. ? Remove the heat if your skin turns bright red. This is especially important if you are unable to feel pain, heat, or cold. You may have a greater risk of getting burned.   General instructions  If you were given a sedative during the procedure, it can affect you for several hours. Do not drive or operate machinery until your health care provider says that it is safe.  For the first 24 hours after the procedure: ? Do not sign important documents. ? Do not drink alcohol. ? Do your regular daily activities at a slower pace than normal. ? Eat soft foods that are easy to digest.  Take over-the-counter and prescription medicines only as told by your health care provider.  Keep all follow-up visits as told by your health care provider. This is important. Contact a health care provider if:  You have blood in your stool 2-3 days after the procedure. Get help right away if you have:  More than a small spotting of blood in your stool.  Large blood clots in your stool.  Swelling  of your abdomen.  Nausea or vomiting.  A fever.  Increasing pain in your abdomen that is not relieved with medicine. Summary  After the procedure, it is common to have a small amount of blood in your stool. You may also have mild cramping and bloating of your abdomen.  If you were given a sedative during the procedure, it can affect you for several hours. Do not drive or operate machinery until your health care provider says that it is safe.  Get help right away if you have a lot of blood in your stool, nausea or vomiting, a fever, or increased pain in your abdomen. This information is not intended to replace advice given to you by your health care provider. Make sure you discuss any questions you have with your health care provider. Document Revised: 01/16/2019 Document Reviewed: 08/18/2018 Elsevier Patient Education  [place]. Colon Polyps  Colon polyps are tissue  growths inside the colon, which is part of the large intestine. They are one of the types of polyps that can grow in the body. A polyp may be a round bump or a mushroom-shaped growth. You could have one polyp or more than one. Most colon polyps are noncancerous (benign). However, some colon polyps can become cancerous over time. Finding and removing the polyps early can help prevent this. What are the causes? The exact cause of colon polyps is not known. What increases the risk? The following factors may make you more likely to develop this condition:  Having a family history of colorectal cancer or colon polyps.  Being older than 49 years of age.  Being younger than 49 years of age and having a significant family history of colorectal cancer or colon polyps or a genetic condition that puts you at higher risk of getting colon polyps.  Having inflammatory bowel disease, such as ulcerative colitis or Crohn's disease.  Having certain conditions passed from parent to child (hereditary conditions), such as: ? Familial adenomatous polyposis (FAP). ? Lynch syndrome. ? Turcot syndrome. ? Peutz-Jeghers syndrome. ? MUTYH-associated polyposis (MAP).  Being overweight.  Certain lifestyle factors. These include smoking cigarettes, drinking too much alcohol, not getting enough exercise, and eating a diet that is high in fat and red meat and low in fiber.  Having had childhood cancer that was treated with radiation of the abdomen. What are the signs or symptoms? Many times, there are no symptoms. If you have symptoms, they may include:  Blood coming from the rectum during a bowel movement.  Blood in the stool (feces). The blood may be bright red or very dark in color.  Pain in the abdomen.  A change in bowel habits, such as constipation or diarrhea. How is this diagnosed? This condition is diagnosed with a colonoscopy. This is a procedure in which a lighted, flexible scope is inserted into  the opening between the buttocks (anus) and then passed into the colon to examine the area. Polyps are sometimes found when a colonoscopy is done as part of routine cancer screening tests. How is this treated? This condition is treated by removing any polyps that are found. Most polyps can be removed during a colonoscopy. Those polyps will then be tested for cancer. Additional treatment may be needed depending on the results of testing. Follow these instructions at home: Eating and drinking  Eat foods that are high in fiber, such as fruits, vegetables, and whole grains.  Eat foods that are high in calcium and vitamin D, such as  milk, cheese, yogurt, eggs, liver, fish, and broccoli.  Limit foods that are high in fat, such as fried foods and desserts.  Limit the amount of red meat, precooked or cured meat, or other processed meat that you eat, such as hot dogs, sausages, bacon, or meat loaves.  Limit sugary drinks.   Lifestyle  Maintain a healthy weight, or lose weight if recommended by your health care provider.  Exercise every day or as told by your health care provider.  Do not use any products that contain nicotine or tobacco, such as cigarettes, e-cigarettes, and chewing tobacco. If you need help quitting, ask your health care provider.  Do not drink alcohol if: ? Your health care provider tells you not to drink. ? You are pregnant, may be pregnant, or are planning to become pregnant.  If you drink alcohol: ? Limit how much you use to:  0-1 drink a day for women.  0-2 drinks a day for men. ? Know how much alcohol is in your drink. In the [place] one drink equals one 12 oz bottle of beer (355 mL), one 5 oz glass of wine (148 mL), or one 1 oz glass of hard liquor (44 mL). General instructions  Take over-the-counter and prescription medicines only as told by your health care provider.  Keep all follow-up visits. This is important. This includes having regularly scheduled  colonoscopies. Talk to your health care provider about when you need a colonoscopy. Contact a health care provider if:  You have new or worsening bleeding during a bowel movement.  You have new or increased blood in your stool.  You have a change in bowel habits.  You lose weight for no known reason. Summary  Colon polyps are tissue growths inside the colon, which is part of the large intestine. They are one type of polyp that can grow in the body.  Most colon polyps are noncancerous (benign), but some can become cancerous over time.  This condition is diagnosed with a colonoscopy.  This condition is treated by removing any polyps that are found. Most polyps can be removed during a colonoscopy. This information is not intended to replace advice given to you by your health care provider. Make sure you discuss any questions you have with your health care provider. Document Revised: 05/13/2019 Document Reviewed: 05/13/2019 Elsevier Patient Education  2021 [place] are being discharged to home.  Resume your previous diet.  We are waiting for your pathology results Your physician recommends repeating a colonoscopy based on pathology results

## 2020-04-13 NOTE — Op Note (Signed)
[facility] Patient Name: Anthony Lin Procedure Date: 04/13/2020 7:16 AM MRN: [id number] Date of Birth: Jul 13, 1970 Attending MD: Maylon Peppers ,  CSN: [id number] Age: 50 Admit Type: Outpatient Procedure:                Upper GI endoscopy Indications:              Abdominal pain in the right upper quadrant Providers:                Maylon Peppers, [place] Sharon Seller, RN, Nelma Rothman, Technician Referring MD:              Medicines:                Monitored Anesthesia Care Complications:            No immediate complications. Estimated Blood Loss:     Estimated blood loss: none. Procedure:                Pre-Anesthesia Assessment:                           - Prior to the procedure, a History and Physical                            was performed, and patient medications, allergies                            and sensitivities were reviewed. The patient's                            tolerance of previous anesthesia was reviewed.                           - The risks and benefits of the procedure and the                            sedation options and risks were discussed with the                            patient. All questions were answered and informed                            consent was obtained.                           - ASA Grade Assessment: I - A normal, healthy                            patient.                           After obtaining informed consent, the endoscope was                            passed under direct vision. Throughout the  procedure, the patient's blood pressure, pulse, and                            oxygen saturations were monitored continuously. The                            [id number]-[id number] ([id number]) scope was introduced through the                            mouth, and advanced to the second part of duodenum.                            The upper GI endoscopy was accomplished without                             difficulty. The patient tolerated the procedure                            well. Scope In: 7:32:38 AM Scope Out: 7:45:08 AM Total Procedure Duration: 0 hours 12 minutes 30 seconds  Findings:      The examined esophagus was normal.      A few 3 to 10 mm sessile polyps with no bleeding were found in the       gastric body. The two largest polyps were removed with a cold snare.       Resection and retrieval were complete with the use of a Roth net.      Two 10 to 15 mm submucosal nodules with no bleeding were found in the       gastric antrum. Biopsies were taken with a cold forceps for histology.      The examined duodenum was normal. Biopsies for histology were taken with       a cold forceps for evaluation of celiac disease. Impression:               - Normal esophagus.                           - A few gastric polyps. Resected and retrieved.                           - Two submucosal nodules found in the stomach.                            Biopsied.                           - Normal examined duodenum. Biopsied. Moderate Sedation:      Per Anesthesia Care Recommendation:           - Discharge patient to home (ambulatory).                           - Resume previous diet.                           - Await pathology results. Procedure Code(s):        --- Professional ---  43251, Esophagogastroduodenoscopy, flexible,                            transoral; with removal of tumor(s), polyp(s), or                            other lesion(s) by snare technique                           43239, 47, Esophagogastroduodenoscopy, flexible,                            transoral; with biopsy, single or multiple Diagnosis Code(s):        --- Professional ---                           K31.7, Polyp of stomach and duodenum                           K31.89, Other diseases of stomach and duodenum                           R10.11, Right upper quadrant pain CPT copyright  2019 American Medical Association. All rights reserved. The codes documented in this report are preliminary and upon coder review may  be revised to meet current compliance requirements. Maylon Peppers, MD Maylon Peppers,  04/13/2020 8:15:23 AM This report has been signed electronically. Number of Addenda: 0

## 2020-04-13 NOTE — Anesthesia Preprocedure Evaluation (Addendum)
Anesthesia Evaluation  Patient identified by MRN, date of birth, ID band Patient awake    Reviewed: Allergy & Precautions, NPO status , Patient's Chart, lab work & pertinent test results  History of Anesthesia Complications Negative for: history of anesthetic complications  Airway Mallampati: III  TM Distance: >3 FB Neck ROM: Full    Dental  (+) Dental Advisory Given, Teeth Intact   Pulmonary neg pulmonary ROS,    Pulmonary exam normal breath sounds clear to auscultation       Cardiovascular Exercise Tolerance: Good hypertension, Pt. on medications Normal cardiovascular exam Rhythm:Regular Rate:Normal  10-Sep-2019 10:28:48 [facility] ROUTINE RECORD Normal sinus rhythm Normal ECG No significant change since last tracing Confirmed by End, Harrell Gave [phone]) on 09/10/2019 3:50:41 PM   Neuro/Psych negative neurological ROS  negative psych ROS   GI/Hepatic Neg liver ROS, GERD  Medicated and Controlled,  Endo/Other  negative endocrine ROS  Renal/GU negative Renal ROS     Musculoskeletal negative musculoskeletal ROS (+)   Abdominal   Peds  Hematology negative hematology ROS (+)   Anesthesia Other Findings   Reproductive/Obstetrics negative OB ROS                            Anesthesia Physical Anesthesia Plan  ASA: II  Anesthesia Plan: General   Post-op Pain Management:    Induction:   PONV Risk Score and Plan: Propofol infusion  Airway Management Planned: Nasal Cannula and Natural Airway  Additional Equipment:   Intra-op Plan:   Post-operative Plan:   Informed Consent: I have reviewed the patients History and Physical, chart, labs and discussed the procedure including the risks, benefits and alternatives for the proposed anesthesia with the patient or authorized representative who has indicated his/her understanding and acceptance.     Dental advisory  given  Plan Discussed with: CRNA and Surgeon  Anesthesia Plan Comments:         Anesthesia Quick Evaluation

## 2020-04-13 NOTE — Op Note (Signed)
[facility] Patient Name: Grigor Lipschutz Procedure Date: 04/13/2020 7:49 AM MRN: [id number] Date of Birth: 1971/01/22 Attending MD: Maylon Peppers ,  CSN: [id number] Age: 50 Admit Type: Outpatient Procedure:                Colonoscopy Indications:              Screening for colorectal malignant neoplasm Providers:                Maylon Peppers, [place] Sharon Seller, RN, Nelma Rothman, Technician Referring MD:              Medicines:                Monitored Anesthesia Care Complications:            No immediate complications. Estimated Blood Loss:     Estimated blood loss: none. Procedure:                Pre-Anesthesia Assessment:                           - Prior to the procedure, a History and Physical                            was performed, and patient medications, allergies                            and sensitivities were reviewed. The patient's                            tolerance of previous anesthesia was reviewed.                           - The risks and benefits of the procedure and the                            sedation options and risks were discussed with the                            patient. All questions were answered and informed                            consent was obtained.                           - ASA Grade Assessment: I - A normal, healthy                            patient.                           After obtaining informed consent, the colonoscope                            was passed under direct vision. Throughout the  procedure, the patient's blood pressure, pulse, and                            oxygen saturations were monitored continuously. The                            [id number]-[id number] ([id number]) scope was introduced through                            the anus and advanced to the the cecum, identified                            by appendiceal orifice and ileocecal valve. The                             colonoscopy was performed without difficulty. The                            patient tolerated the procedure well. The quality                            of the bowel preparation was adequate. Scope                            withdrawal time was 12 minutes. Scope In: 7:51:54 AM Scope Out: 8:09:37 AM Scope Withdrawal Time: 0 hours 13 minutes 4 seconds  Total Procedure Duration: 0 hours 17 minutes 43 seconds  Findings:      The perianal and digital rectal examinations were normal.      A 8 mm polyp was found in the proximal ascending colon. The polyp was       sessile. The polyp was removed with a cold snare. Resection and       retrieval were complete.      The retroflexed view of the distal rectum and anal verge was normal and       showed no anal or rectal abnormalities. Impression:               - One 8 mm polyp in the proximal ascending colon,                            removed with a cold snare. Resected and retrieved.                           - The distal rectum and anal verge are normal on                            retroflexion view. Moderate Sedation:      Per Anesthesia Care Recommendation:           - Discharge patient to home (ambulatory).                           - Resume previous diet.                           -  Await pathology results.                           - Repeat colonoscopy for surveillance based on                            pathology results. Procedure Code(s):        --- Professional ---                           [phone], Colonoscopy, flexible; with removal of                            tumor(s), polyp(s), or other lesion(s) by snare                            technique Diagnosis Code(s):        --- Professional ---                           Z12.11, Encounter for screening for malignant                            neoplasm of colon                           K63.5, Polyp of colon CPT copyright 2019 American Medical Association. All rights reserved. The  codes documented in this report are preliminary and upon coder review may  be revised to meet current compliance requirements. Maylon Peppers, MD Maylon Peppers,  04/13/2020 8:17:19 AM This report has been signed electronically. Number of Addenda: 0

## 2020-04-13 NOTE — Transfer of Care (Signed)
Immediate Anesthesia Transfer of Care Note  Patient: Anthony Lin  Procedure(s) Performed: ESOPHAGOGASTRODUODENOSCOPY (EGD) WITH PROPOFOL (N/A ) COLONOSCOPY WITH PROPOFOL (N/A ) BIOPSY POLYPECTOMY POLYPECTOMY INTESTINAL  Patient Location: Endoscopy Unit  Anesthesia Type:General  Level of Consciousness: awake  Airway & Oxygen Therapy: Patient Spontanous Breathing  Post-op Assessment: Report given to RN  Post vital signs: Reviewed and stable  Last Vitals:  Vitals Value Taken Time  BP    Temp    Pulse    Resp    SpO2      Last Pain:  Vitals:   04/13/20 0727  TempSrc:   PainSc: 0-No pain      Patients Stated Pain Goal: 10 (96/78/93 8101)  Complications: No complications documented.

## 2020-04-14 ENCOUNTER — Encounter (INDEPENDENT_AMBULATORY_CARE_PROVIDER_SITE_OTHER): Payer: Self-pay | Admitting: *Deleted

## 2020-04-14 LAB — SURGICAL PATHOLOGY

## 2020-04-15 ENCOUNTER — Encounter (HOSPITAL_COMMUNITY): Payer: Self-pay | Admitting: Gastroenterology

## 2020-04-19 ENCOUNTER — Other Ambulatory Visit: Payer: Self-pay

## 2020-04-19 ENCOUNTER — Telehealth: Payer: Self-pay

## 2020-04-19 DIAGNOSIS — K3189 Other diseases of stomach and duodenum: Secondary | ICD-10-CM

## 2020-04-19 NOTE — Telephone Encounter (Signed)
-----   Message from Irving Copas., MD sent at 04/18/2020  5:09 PM EDT ----- Regarding: RE: EUS Marquasha Brutus, Nonurgent EUS with DJ or myself in the next few months is reasonable to further evaluate the subepithelial lesions. Thanks. GM ----- Message ----- From: Timothy Lasso, RN Sent: 04/14/2020  12:09 PM EDT To: Milus Banister, MD, # Subject: FW: EUS                                         ----- Message ----- From: Worthy Keeler Sent: 04/14/2020  12:04 PM EST To: Timothy Lasso, RN Subject: EUS                                            Per Dr Jenetta Downer, patient needs EUS for submucosal gastric nodule

## 2020-04-19 NOTE — Telephone Encounter (Signed)
EUS scheduled for 5/12 at [facility] with Dr Ardis Hughs at 730 am. COVID test on 06/13/20 at 1005 am  Left message on machine to call back

## 2020-04-20 NOTE — Telephone Encounter (Signed)
EUS scheduled, pt instructed and medications reviewed.  Patient instructions mailed to home.  Patient to call with any questions or concerns.  

## 2020-04-25 ENCOUNTER — Telehealth: Payer: Self-pay

## 2020-04-25 NOTE — Telephone Encounter (Signed)
-----   Message from Milus Banister, MD sent at 04/23/2020  6:38 AM EDT ----- Regarding: RE: EUS I agree, thanks  ----- Message ----- From: Irving Copas., MD Sent: 04/18/2020   5:11 PM EDT To: Milus Banister, MD, Timothy Lasso, RN, # Subject: RE: EUS                                        Lynora Dymond, Nonurgent EUS with DJ or myself in the next few months is reasonable to further evaluate the subepithelial lesions. Thanks. GM ----- Message ----- From: Timothy Lasso, RN Sent: 04/14/2020  12:09 PM EDT To: Milus Banister, MD, # Subject: FW: EUS                                         ----- Message ----- From: Worthy Keeler Sent: 04/14/2020  12:04 PM EST To: Timothy Lasso, RN Subject: EUS                                            Per Dr Jenetta Downer, patient needs EUS for submucosal gastric nodule

## 2020-04-26 ENCOUNTER — Other Ambulatory Visit: Payer: Self-pay

## 2020-04-26 DIAGNOSIS — K3189 Other diseases of stomach and duodenum: Secondary | ICD-10-CM

## 2020-04-26 NOTE — Telephone Encounter (Signed)
The pt has been scheduled and instructed for 5/12 EUS with Dr Ardis Hughs.

## 2020-06-13 ENCOUNTER — Encounter (HOSPITAL_COMMUNITY): Payer: Self-pay | Admitting: Gastroenterology

## 2020-06-13 ENCOUNTER — Other Ambulatory Visit (HOSPITAL_COMMUNITY)
Admission: RE | Admit: 2020-06-13 | Discharge: 2020-06-13 | Disposition: A | Discharge status: Home / Self Care | Payer: BC Managed Care – PPO | Source: Ambulatory Visit | Attending: Gastroenterology | Admitting: Gastroenterology

## 2020-06-13 DIAGNOSIS — Z820 Family history of epilepsy and other diseases of the nervous system: Secondary | ICD-10-CM | POA: Diagnosis not present

## 2020-06-13 DIAGNOSIS — K219 Gastro-esophageal reflux disease without esophagitis: Secondary | ICD-10-CM | POA: Diagnosis not present

## 2020-06-13 DIAGNOSIS — F17220 Nicotine dependence, chewing tobacco, uncomplicated: Secondary | ICD-10-CM | POA: Diagnosis not present

## 2020-06-13 DIAGNOSIS — Z20822 Contact with and (suspected) exposure to covid-19: Secondary | ICD-10-CM | POA: Insufficient documentation

## 2020-06-13 DIAGNOSIS — D175 Benign lipomatous neoplasm of intra-abdominal organs: Secondary | ICD-10-CM | POA: Diagnosis not present

## 2020-06-13 DIAGNOSIS — K3189 Other diseases of stomach and duodenum: Secondary | ICD-10-CM | POA: Diagnosis present

## 2020-06-13 DIAGNOSIS — Z801 Family history of malignant neoplasm of trachea, bronchus and lung: Secondary | ICD-10-CM | POA: Diagnosis not present

## 2020-06-13 DIAGNOSIS — I10 Essential (primary) hypertension: Secondary | ICD-10-CM | POA: Diagnosis not present

## 2020-06-13 DIAGNOSIS — Z01812 Encounter for preprocedural laboratory examination: Secondary | ICD-10-CM | POA: Insufficient documentation

## 2020-06-13 DIAGNOSIS — Z881 Allergy status to other antibiotic agents status: Secondary | ICD-10-CM | POA: Diagnosis not present

## 2020-06-13 DIAGNOSIS — Z8249 Family history of ischemic heart disease and other diseases of the circulatory system: Secondary | ICD-10-CM | POA: Diagnosis not present

## 2020-06-13 DIAGNOSIS — Z8049 Family history of malignant neoplasm of other genital organs: Secondary | ICD-10-CM | POA: Diagnosis not present

## 2020-06-13 DIAGNOSIS — Z8261 Family history of arthritis: Secondary | ICD-10-CM | POA: Diagnosis not present

## 2020-06-13 LAB — SARS CORONAVIRUS 2 (TAT 6-24 HRS): SARS Coronavirus 2: NEGATIVE

## 2020-06-16 ENCOUNTER — Encounter (HOSPITAL_COMMUNITY): Payer: Self-pay | Admitting: Gastroenterology

## 2020-06-16 ENCOUNTER — Encounter (HOSPITAL_COMMUNITY)
Admission: RE | Disposition: A | Discharge status: Home / Self Care | Payer: Self-pay | Source: Home / Self Care | Attending: Gastroenterology

## 2020-06-16 ENCOUNTER — Ambulatory Visit (HOSPITAL_COMMUNITY)
Admission: RE | Admit: 2020-06-16 | Discharge: 2020-06-16 | Disposition: A | Discharge status: Home / Self Care | Payer: BC Managed Care – PPO | Attending: Gastroenterology | Admitting: Gastroenterology

## 2020-06-16 ENCOUNTER — Ambulatory Visit (HOSPITAL_COMMUNITY): Payer: BC Managed Care – PPO | Admitting: Anesthesiology

## 2020-06-16 ENCOUNTER — Other Ambulatory Visit: Payer: Self-pay

## 2020-06-16 DIAGNOSIS — D175 Benign lipomatous neoplasm of intra-abdominal organs: Secondary | ICD-10-CM | POA: Insufficient documentation

## 2020-06-16 DIAGNOSIS — F17220 Nicotine dependence, chewing tobacco, uncomplicated: Secondary | ICD-10-CM | POA: Insufficient documentation

## 2020-06-16 DIAGNOSIS — Z820 Family history of epilepsy and other diseases of the nervous system: Secondary | ICD-10-CM | POA: Insufficient documentation

## 2020-06-16 DIAGNOSIS — Z20822 Contact with and (suspected) exposure to covid-19: Secondary | ICD-10-CM | POA: Insufficient documentation

## 2020-06-16 DIAGNOSIS — Z8249 Family history of ischemic heart disease and other diseases of the circulatory system: Secondary | ICD-10-CM | POA: Insufficient documentation

## 2020-06-16 DIAGNOSIS — K3189 Other diseases of stomach and duodenum: Secondary | ICD-10-CM | POA: Diagnosis not present

## 2020-06-16 DIAGNOSIS — K219 Gastro-esophageal reflux disease without esophagitis: Secondary | ICD-10-CM | POA: Insufficient documentation

## 2020-06-16 DIAGNOSIS — Z881 Allergy status to other antibiotic agents status: Secondary | ICD-10-CM | POA: Insufficient documentation

## 2020-06-16 DIAGNOSIS — Z8049 Family history of malignant neoplasm of other genital organs: Secondary | ICD-10-CM | POA: Insufficient documentation

## 2020-06-16 DIAGNOSIS — Z801 Family history of malignant neoplasm of trachea, bronchus and lung: Secondary | ICD-10-CM | POA: Insufficient documentation

## 2020-06-16 DIAGNOSIS — Z8261 Family history of arthritis: Secondary | ICD-10-CM | POA: Insufficient documentation

## 2020-06-16 DIAGNOSIS — I10 Essential (primary) hypertension: Secondary | ICD-10-CM | POA: Insufficient documentation

## 2020-06-16 HISTORY — PX: EUS: SHX5427

## 2020-06-16 HISTORY — PX: ESOPHAGOGASTRODUODENOSCOPY (EGD) WITH PROPOFOL: SHX5813

## 2020-06-16 SURGERY — UPPER ENDOSCOPIC ULTRASOUND (EUS) RADIAL
Anesthesia: Monitor Anesthesia Care

## 2020-06-16 MED ORDER — LACTATED RINGERS IV SOLN
INTRAVENOUS | Status: DC
  Administered 2020-06-16: 1000 mL via INTRAVENOUS

## 2020-06-16 MED ORDER — SODIUM CHLORIDE 0.9 % IV SOLN: INTRAVENOUS | Status: DC

## 2020-06-16 MED ORDER — LIDOCAINE HCL (CARDIAC) PF 100 MG/5ML IV SOSY
PREFILLED_SYRINGE | INTRAVENOUS | Status: DC | PRN
  Administered 2020-06-16: 75 mg via INTRAVENOUS

## 2020-06-16 MED ORDER — PROPOFOL 500 MG/50ML IV EMUL
INTRAVENOUS | Status: DC | PRN
  Administered 2020-06-16: 200 ug/kg/min via INTRAVENOUS

## 2020-06-16 MED ORDER — GLYCOPYRROLATE 0.2 MG/ML IJ SOLN
INTRAMUSCULAR | Status: DC | PRN
  Administered 2020-06-16: .1 mg via INTRAVENOUS

## 2020-06-16 NOTE — Anesthesia Preprocedure Evaluation (Addendum)
Anesthesia Evaluation  Patient identified by MRN, date of birth, ID band Patient awake    Reviewed: Allergy & Precautions, NPO status , Patient's Chart, lab work & pertinent test results  Airway Mallampati: II  TM Distance: >3 FB Neck ROM: Full   Comment: Large neck circumference Dental no notable dental hx.    Pulmonary neg pulmonary ROS,    Pulmonary exam normal breath sounds clear to auscultation       Cardiovascular hypertension, negative cardio ROS Normal cardiovascular exam Rhythm:Regular Rate:Normal     Neuro/Psych negative neurological ROS  negative psych ROS   GI/Hepatic Neg liver ROS, GERD  Medicated and Controlled,  Endo/Other  obesity  Renal/GU negative Renal ROS  negative genitourinary   Musculoskeletal negative musculoskeletal ROS (+)   Abdominal   Peds negative pediatric ROS (+)  Hematology negative hematology ROS (+)   Anesthesia Other Findings   Reproductive/Obstetrics negative OB ROS                            Anesthesia Physical Anesthesia Plan  ASA: II  Anesthesia Plan: MAC   Post-op Pain Management:    Induction: Intravenous  PONV Risk Score and Plan: 1 and Propofol infusion, TIVA and Treatment may vary due to age or medical condition  Airway Management Planned: Natural Airway and Nasal Cannula  Additional Equipment: None  Intra-op Plan:   Post-operative Plan:   Informed Consent: I have reviewed the patients History and Physical, chart, labs and discussed the procedure including the risks, benefits and alternatives for the proposed anesthesia with the patient or authorized representative who has indicated his/her understanding and acceptance.     Dental advisory given  Plan Discussed with: CRNA and Anesthesiologist  Anesthesia Plan Comments:         Anesthesia Quick Evaluation

## 2020-06-16 NOTE — Anesthesia Postprocedure Evaluation (Signed)
Anesthesia Post Note  Patient: Anthony Lin  Procedure(s) Performed: UPPER ENDOSCOPIC ULTRASOUND (EUS) RADIAL (N/A )     Patient location during evaluation: Endoscopy Anesthesia Type: MAC Level of consciousness: awake and alert Pain management: pain level controlled Vital Signs Assessment: post-procedure vital signs reviewed and stable Respiratory status: spontaneous breathing, nonlabored ventilation and respiratory function stable Cardiovascular status: blood pressure returned to baseline and stable Postop Assessment: no apparent nausea or vomiting Anesthetic complications: no   No complications documented.  Last Vitals:  Vitals:   06/16/20 0810 06/16/20 0820  BP: 119/78 115/66  Pulse: 80 71  Resp: (!) 21 19  Temp:    SpO2: 96% 98%    Last Pain:  Vitals:   06/16/20 0820  TempSrc:   PainSc: 0-No pain                 Merlinda Frederick

## 2020-06-16 NOTE — Discharge Instructions (Signed)
YOU HAD AN ENDOSCOPIC PROCEDURE TODAY: Refer to the procedure report and other information in the discharge instructions given to you for any specific questions about what was found during the examination. If this information does not answer your questions, please call [place] office at [phone] to clarify.   YOU SHOULD EXPECT: Some feelings of bloating in the abdomen. Passage of more gas than usual. Walking can help get rid of the air that was put into your GI tract during the procedure and reduce the bloating. If you had a lower endoscopy (such as a colonoscopy or flexible sigmoidoscopy) you may notice spotting of blood in your stool or on the toilet paper. Some abdominal soreness may be present for a day or two, also.  DIET: Your first meal following the procedure should be a light meal and then it is ok to progress to your normal diet. A half-sandwich or bowl of soup is an example of a good first meal. Heavy or fried foods are harder to digest and may make you feel nauseous or bloated. Drink plenty of fluids but you should avoid alcoholic beverages for 24 hours. If you had a esophageal dilation, please see attached instructions for diet.    ACTIVITY: Your care partner should take you home directly after the procedure. You should plan to take it easy, moving slowly for the rest of the day. You can resume normal activity the day after the procedure however YOU SHOULD NOT DRIVE, use power tools, machinery or perform tasks that involve climbing or major physical exertion for 24 hours (because of the sedation medicines used during the test).   SYMPTOMS TO REPORT IMMEDIATELY: A gastroenterologist can be reached at any hour. Please call [phone]  for any of the following symptoms:  . Following upper endoscopy (EGD, EUS, ERCP, esophageal dilation) Vomiting of blood or coffee ground material  New, significant abdominal pain  New, significant chest pain or pain under the shoulder blades  Painful or  persistently difficult swallowing  New shortness of breath  Black, tarry-looking or red, bloody stools  FOLLOW UP:  If any biopsies were taken you will be contacted by phone or by letter within the next 1-3 weeks. Call [phone]  if you have not heard about the biopsies in 3 weeks.  Please also call with any specific questions about appointments or follow up tests.YOU HAD AN ENDOSCOPIC PROCEDURE TODAY: Refer to the procedure report and other information in the discharge instructions given to you for any specific questions about what was found during the examination. If this information does not answer your questions, please call [place] office at [phone] to clarify.   YOU SHOULD EXPECT: Some feelings of bloating in the abdomen. Passage of more gas than usual. Walking can help get rid of the air that was put into your GI tract during the procedure and reduce the bloating. If you had a lower endoscopy (such as a colonoscopy or flexible sigmoidoscopy) you may notice spotting of blood in your stool or on the toilet paper. Some abdominal soreness may be present for a day or two, also.  DIET: Your first meal following the procedure should be a light meal and then it is ok to progress to your normal diet. A half-sandwich or bowl of soup is an example of a good first meal. Heavy or fried foods are harder to digest and may make you feel nauseous or bloated. Drink plenty of fluids but you should avoid alcoholic beverages for 24 hours. If you had   a esophageal dilation, please see attached instructions for diet.    ACTIVITY: Your care partner should take you home directly after the procedure. You should plan to take it easy, moving slowly for the rest of the day. You can resume normal activity the day after the procedure however YOU SHOULD NOT DRIVE, use power tools, machinery or perform tasks that involve climbing or major physical exertion for 24 hours (because of the sedation medicines used during the  test).   SYMPTOMS TO REPORT IMMEDIATELY: A gastroenterologist can be reached at any hour. Please call [phone]  for any of the following symptoms:   . Following upper endoscopy (EGD, EUS, ERCP, esophageal dilation) Vomiting of blood or coffee ground material  New, significant abdominal pain  New, significant chest pain or pain under the shoulder blades  Painful or persistently difficult swallowing  New shortness of breath  Black, tarry-looking or red, bloody stools  FOLLOW UP:  If any biopsies were taken you will be contacted by phone or by letter within the next 1-3 weeks. Call [phone]  if you have not heard about the biopsies in 3 weeks.  Please also call with any specific questions about appointments or follow up tests. 

## 2020-06-16 NOTE — Op Note (Signed)
[facility] Patient Name: Anthony Lin Procedure Date: 06/16/2020 MRN: [id number] Attending MD: Milus Banister , MD Date of Birth: [birth year] CSN: [id number] Age: 50 Admit Type: Outpatient Procedure:                Upper EUS Indications:              Two antral subepithelial lesions noted on recent EGD Providers:                Milus Banister, MD, Burtis Junes, RN, Laverda Sorenson,                            Technician, Enrigue Catena, CRNA Referring MD:             Maylon Peppers, MD Medicines:                Monitored Anesthesia Care Complications:            No immediate complications. Estimated blood loss:                            None. Estimated Blood Loss:     Estimated blood loss: none. Procedure:                Pre-Anesthesia Assessment:                           - Prior to the procedure, a History and Physical                            was performed, and patient medications and                            allergies were reviewed. The patient's tolerance of                            previous anesthesia was also reviewed. The risks                            and benefits of the procedure and the sedation                            options and risks were discussed with the patient.                            All questions were answered, and informed consent                            was obtained. Prior Anticoagulants: The patient has                            taken no previous anticoagulant or antiplatelet                            agents. ASA Grade Assessment: II - A patient with  mild systemic disease. After reviewing the risks                            and benefits, the patient was deemed in                            satisfactory condition to undergo the procedure.                           After obtaining informed consent, the endoscope was                            passed under direct vision. Throughout the                             procedure, the patient's blood pressure, pulse, and                            oxygen saturations were monitored continuously. The                            [id number]-[id number] ([id number]) Olympus Radial EUS was                            introduced through the mouth, and advanced to the                            second part of duodenum. The GIF-[id number] ([id number]) was                            introduced through the mouth, and advanced to the                            second part of duodenum. The upper EUS was                            accomplished without difficulty. The patient                            tolerated the procedure well. Scope In: Scope Out: Findings:      ENDOSCOPIC FINDING: :      The examined esophagus was endoscopically normal.      Two small subepthelial lesions in the gastric antrum, normal overlying       mucosa. The largest was about 1cm across endoscopically.      The examined duodenum was endoscopically normal.      ENDOSONOGRAPHIC FINDING: :      1. The subepthelial lesions noted above correlated with two hyperechoic,       well circumscribed, oval shaped lesions that were confined to the deep       mucosa, submucosa layer of the antral wall. The lesion were 12mm and 75mm       maximally. The gastric wall was otherwise normal.      2. No perigastric adenopathy.      3. Limited views of the liver,  pancreas, spleen were all normal. 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 These are of no clinical concern and do                            not require further testing. Moderate Sedation:      Not Applicable - Patient had care per Anesthesia. Recommendation:           - Discharge patient to home (ambulatory).                           - Follow up with Dr. Jenetta Downer as needed. Procedure Code(s):        --- Professional ---                           [phone], Esophagogastroduodenoscopy, flexible,                             transoral; with endoscopic ultrasound examination                            limited to the esophagus, stomach or duodenum, and                            adjacent structures Diagnosis Code(s):        --- Professional ---                           K31.89, Other diseases of stomach and duodenum CPT copyright 2019 [employer] Association. All rights reserved. The codes documented in this report are preliminary and upon coder review may  be revised to meet current compliance requirements. Milus Banister, MD 06/16/2020 7:56:47 AM This report has been signed electronically. Number of Addenda: 0

## 2020-06-16 NOTE — Anesthesia Procedure Notes (Addendum)
Procedure Name: MAC Date/Time: 06/16/2020 7:18 AM Performed by: Lissa Morales, CRNA Pre-anesthesia Checklist: Timeout performed, Patient being monitored, Patient identified, Emergency Drugs available and Suction available Patient Re-evaluated:Patient Re-evaluated prior to induction Oxygen Delivery Method: Simple face mask Preoxygenation: Pre-oxygenation with 100% oxygen Placement Confirmation: positive ETCO2

## 2020-06-16 NOTE — Transfer of Care (Signed)
Immediate Anesthesia Transfer of Care Note  Patient: Anthony Lin  Procedure(s) Performed: UPPER ENDOSCOPIC ULTRASOUND (EUS) RADIAL (N/A )  Patient Location: PACU  Anesthesia Type:MAC  Level of Consciousness: awake, sedated and patient cooperative  Airway & Oxygen Therapy: Patient Spontanous Breathing and Patient connected to face mask oxygen  Post-op Assessment: Report given to RN and Post -op Vital signs reviewed and stable  Post vital signs: stable  Last Vitals:  Vitals Value Taken Time  BP 103/65 06/16/20 0750  Temp    Pulse 81 06/16/20 0750  Resp 18 06/16/20 0750  SpO2 100 % 06/16/20 0750  Vitals shown include unvalidated device data.  Last Pain:  Vitals:   06/16/20 0643  TempSrc: Oral  PainSc: 0-No pain         Complications: No complications documented.

## 2020-06-16 NOTE — H&P (Signed)
HPI: This is a man with abnormal stomach on recent Dr. Cherlynn June EGD    ROS: complete GI ROS as described in HPI, all other review negative.  Constitutional:  No unintentional weight loss   Past Medical History:  Diagnosis Date  . GERD (gastroesophageal reflux disease)   . Hypertension     Past Surgical History:  Procedure Laterality Date  . APPENDECTOMY    . BIOPSY  04/13/2020   Procedure: BIOPSY;  Surgeon: Harvel Quale, MD;  [facility]: [facility];  Service: Gastroenterology;;  duodenal gastric  . CARPAL TUNNEL RELEASE Right 09/14/2019   Procedure: RIGHT CARPAL TUNNEL RELEASE;  Surgeon: Leanora Cover, MD;  Location: [place];  Service: Orthopedics;  Laterality: Right;  Bier block  . CARPAL TUNNEL RELEASE Left 10/05/2019   Procedure: CARPAL TUNNEL RELEASE;  Surgeon: Leanora Cover, MD;  Location: [place];  Service: Orthopedics;  Laterality: Left;  . CHOLECYSTECTOMY    . COLONOSCOPY WITH PROPOFOL N/A 04/13/2020   Procedure: COLONOSCOPY WITH PROPOFOL;  Surgeon: Harvel Quale, MD;  Location: [facility];  Service: Gastroenterology;  Laterality: N/A;  . ESOPHAGOGASTRODUODENOSCOPY (EGD) WITH PROPOFOL N/A 04/13/2020   Procedure: ESOPHAGOGASTRODUODENOSCOPY (EGD) WITH PROPOFOL;  Surgeon: Harvel Quale, MD;  Location: [facility];  Service: Gastroenterology;  Laterality: N/A;  AM  . POLYPECTOMY  04/13/2020   Procedure: POLYPECTOMY;  Surgeon: Harvel Quale, MD;  Location: [facility];  Service: Gastroenterology;;  gastric  . POLYPECTOMY  04/13/2020   Procedure: POLYPECTOMY INTESTINAL;  Surgeon: Harvel Quale, MD;  Location: [facility];  Service: Gastroenterology;;  colon  . TONSILLECTOMY      Current Facility-Administered Medications  Medication Dose Route Frequency Provider Last Rate Last Admin  . 0.9 %  sodium chloride infusion   Intravenous Continuous Milus Banister, MD      . lactated  ringers infusion   Intravenous Continuous Milus Banister, MD 10 mL/hr at 06/16/20 0700 1,000 mL at 06/16/20 0700    Allergies as of 04/19/2020 - Review Complete 04/13/2020  Allergen Reaction Noted  . Zithromax [azithromycin] Palpitations 05/18/2013    Family History  Problem Relation Age of Onset  . Heart disease Mother   . Dementia Mother   . Lung cancer Father   . Arthritis Father   . Healthy Sister   . Healthy Sister   . Cervical cancer Sister   . Heart disease Brother   . Healthy Brother   . Seizures Brother     Social History   Socioeconomic History  . Marital status: Married    Spouse name: Not on file  . Number of children: Not on file  . Years of education: Not on file  . Highest education level: Not on file  Occupational History  . Not on file  Tobacco Use  . Smoking status: Never Smoker  . Smokeless tobacco: Current User    Types: Chew  Vaping Use  . Vaping Use: Never used  Substance and Sexual Activity  . Alcohol use: No  . Drug use: No  . Sexual activity: Not on file  Other Topics Concern  . Not on file  Social History Narrative  . Not on file   Social Determinants of Health   Financial Resource Strain: Not on file  Food Insecurity: Not on file  Transportation Needs: Not on file  Physical Activity: Not on file  Stress: Not on file  Social Connections: Not on file  Intimate Partner Violence:  Not on file     Physical Exam: BP 133/66   Pulse 80   Temp 98.1 F (36.7 C) (Oral)   Resp 19   Ht 5\' 11"  (1.803 m)   Wt 122.5 kg   SpO2 98%   BMI 37.67 kg/m  Constitutional: generally well-appearing Psychiatric: alert and oriented x3 Abdomen: soft, nontender, nondistended, no obvious ascites, no peritoneal signs, normal bowel sounds No peripheral edema noted in lower extremities  Assessment and plan: 50 y.o. male with abnormal stomach on recent outside EGD (dr. Jenkins Rouge) For upper EUS evaluation this morning.  Please see the "Patient  Instructions" section for addition details about the plan.  Owens Loffler, MD [facility] 06/16/2020, 7:07 AM

## 2020-06-19 ENCOUNTER — Encounter (HOSPITAL_COMMUNITY): Payer: Self-pay | Admitting: Gastroenterology

## 2020-06-20 ENCOUNTER — Ambulatory Visit (INDEPENDENT_AMBULATORY_CARE_PROVIDER_SITE_OTHER): Payer: BC Managed Care – PPO | Admitting: Gastroenterology

## 2020-06-23 SURGERY — UPPER ENDOSCOPIC ULTRASOUND (EUS) RADIAL
Anesthesia: Monitor Anesthesia Care

## 2020-08-18 ENCOUNTER — Ambulatory Visit (INDEPENDENT_AMBULATORY_CARE_PROVIDER_SITE_OTHER): Payer: BC Managed Care – PPO | Admitting: Gastroenterology

## 2024-03-27 ENCOUNTER — Ambulatory Visit: Admitting: Physical Therapy
